# Patient Record
Sex: Female | Born: 1935
Health system: Southern US, Community
[De-identification: ages and names within clinical notes are randomized; demographics above are authoritative.]

## PROBLEM LIST (undated history)

## (undated) ENCOUNTER — Emergency Department (HOSPITAL_COMMUNITY): Admission: EM | Discharge status: Absent without leave | Payer: Medicare Other

## (undated) DIAGNOSIS — K5792 Diverticulitis of intestine, part unspecified, without perforation or abscess without bleeding: Secondary | ICD-10-CM

## (undated) DIAGNOSIS — K219 Gastro-esophageal reflux disease without esophagitis: Secondary | ICD-10-CM

## (undated) DIAGNOSIS — I679 Cerebrovascular disease, unspecified: Secondary | ICD-10-CM

## (undated) DIAGNOSIS — F419 Anxiety disorder, unspecified: Secondary | ICD-10-CM

## (undated) DIAGNOSIS — Z789 Other specified health status: Secondary | ICD-10-CM

## (undated) DIAGNOSIS — E785 Hyperlipidemia, unspecified: Secondary | ICD-10-CM

## (undated) HISTORY — DX: Cerebrovascular disease, unspecified: I67.9

## (undated) HISTORY — PX: CHOLECYSTECTOMY: SHX55

## (undated) HISTORY — DX: Hyperlipidemia, unspecified: E78.5

## (undated) HISTORY — DX: Anxiety disorder, unspecified: F41.9

## (undated) HISTORY — DX: Diverticulitis of intestine, part unspecified, without perforation or abscess without bleeding: K57.92

## (undated) HISTORY — PX: APPENDECTOMY: SHX54

## (undated) HISTORY — PX: TONSILLECTOMY: SHX5217

## (undated) HISTORY — DX: Other specified health status: Z78.9

## (undated) HISTORY — DX: Gastro-esophageal reflux disease without esophagitis: K21.9

---

## 2002-03-15 ENCOUNTER — Ambulatory Visit (HOSPITAL_COMMUNITY): Admission: RE | Admit: 2002-03-15 | Discharge: 2002-03-15 | Payer: Self-pay | Admitting: Internal Medicine

## 2004-12-09 ENCOUNTER — Ambulatory Visit: Payer: Self-pay | Admitting: Internal Medicine

## 2004-12-20 ENCOUNTER — Ambulatory Visit (HOSPITAL_COMMUNITY): Admission: RE | Admit: 2004-12-20 | Discharge: 2004-12-20 | Payer: Self-pay | Admitting: Internal Medicine

## 2004-12-20 ENCOUNTER — Ambulatory Visit: Payer: Self-pay | Admitting: Internal Medicine

## 2005-04-15 ENCOUNTER — Ambulatory Visit: Payer: Self-pay | Admitting: Cardiology

## 2005-04-21 ENCOUNTER — Ambulatory Visit: Payer: Self-pay | Admitting: Internal Medicine

## 2005-05-05 ENCOUNTER — Ambulatory Visit: Payer: Self-pay | Admitting: Internal Medicine

## 2005-05-22 ENCOUNTER — Ambulatory Visit: Payer: Self-pay | Admitting: Cardiology

## 2005-06-23 ENCOUNTER — Ambulatory Visit: Payer: Self-pay | Admitting: Cardiology

## 2006-04-28 ENCOUNTER — Ambulatory Visit: Payer: Self-pay | Admitting: Internal Medicine

## 2006-05-01 ENCOUNTER — Ambulatory Visit (HOSPITAL_COMMUNITY): Admission: RE | Admit: 2006-05-01 | Discharge: 2006-05-01 | Payer: Self-pay | Admitting: Internal Medicine

## 2006-05-07 ENCOUNTER — Ambulatory Visit: Payer: Self-pay | Admitting: Internal Medicine

## 2006-06-08 ENCOUNTER — Ambulatory Visit: Payer: Self-pay | Admitting: Cardiology

## 2006-06-23 ENCOUNTER — Ambulatory Visit: Payer: Self-pay | Admitting: Internal Medicine

## 2006-07-30 ENCOUNTER — Ambulatory Visit (HOSPITAL_COMMUNITY): Admission: RE | Admit: 2006-07-30 | Discharge: 2006-07-30 | Payer: Self-pay | Admitting: Internal Medicine

## 2006-10-05 ENCOUNTER — Ambulatory Visit (HOSPITAL_COMMUNITY): Admission: RE | Admit: 2006-10-05 | Discharge: 2006-10-05 | Payer: Self-pay | Admitting: Ophthalmology

## 2006-11-08 ENCOUNTER — Ambulatory Visit: Payer: Self-pay | Admitting: Cardiology

## 2007-01-27 ENCOUNTER — Ambulatory Visit (HOSPITAL_COMMUNITY): Admission: RE | Admit: 2007-01-27 | Discharge: 2007-01-27 | Payer: Self-pay | Admitting: Internal Medicine

## 2007-06-16 ENCOUNTER — Ambulatory Visit: Payer: Self-pay | Admitting: Internal Medicine

## 2007-09-27 ENCOUNTER — Ambulatory Visit: Payer: Self-pay | Admitting: Cardiology

## 2008-02-11 ENCOUNTER — Ambulatory Visit (HOSPITAL_COMMUNITY): Admission: RE | Admit: 2008-02-11 | Discharge: 2008-02-11 | Payer: Self-pay | Admitting: Internal Medicine

## 2008-04-10 ENCOUNTER — Ambulatory Visit: Payer: Self-pay | Admitting: Cardiology

## 2008-04-12 ENCOUNTER — Ambulatory Visit: Payer: Self-pay | Admitting: Cardiovascular Disease

## 2008-04-12 ENCOUNTER — Inpatient Hospital Stay (HOSPITAL_BASED_OUTPATIENT_CLINIC_OR_DEPARTMENT_OTHER): Admission: RE | Admit: 2008-04-12 | Discharge: 2008-04-12 | Payer: Self-pay | Admitting: Cardiovascular Disease

## 2008-04-18 ENCOUNTER — Ambulatory Visit: Payer: Self-pay | Admitting: Cardiology

## 2008-04-18 DIAGNOSIS — R079 Chest pain, unspecified: Secondary | ICD-10-CM

## 2008-04-18 DIAGNOSIS — E785 Hyperlipidemia, unspecified: Secondary | ICD-10-CM | POA: Insufficient documentation

## 2008-04-18 DIAGNOSIS — I2510 Atherosclerotic heart disease of native coronary artery without angina pectoris: Secondary | ICD-10-CM

## 2008-06-13 ENCOUNTER — Ambulatory Visit: Payer: Self-pay | Admitting: Internal Medicine

## 2008-08-15 ENCOUNTER — Encounter: Payer: Self-pay | Admitting: Internal Medicine

## 2008-08-30 ENCOUNTER — Encounter: Payer: Self-pay | Admitting: Internal Medicine

## 2008-09-04 ENCOUNTER — Ambulatory Visit (HOSPITAL_COMMUNITY): Admission: RE | Admit: 2008-09-04 | Discharge: 2008-09-04 | Payer: Self-pay | Admitting: Internal Medicine

## 2009-01-02 DIAGNOSIS — R1013 Epigastric pain: Secondary | ICD-10-CM

## 2009-01-02 DIAGNOSIS — K222 Esophageal obstruction: Secondary | ICD-10-CM | POA: Insufficient documentation

## 2009-01-02 DIAGNOSIS — R634 Abnormal weight loss: Secondary | ICD-10-CM

## 2009-01-02 DIAGNOSIS — R109 Unspecified abdominal pain: Secondary | ICD-10-CM | POA: Insufficient documentation

## 2010-10-08 NOTE — Assessment & Plan Note (Signed)
NAMEMarland Kitchen  Debra Spencer, Debra Spencer                CHART#:  [id number]   DATE:  06/16/2007                       DOB:  1927-08-13   Follow-up of gastroesophageal reflux disease.  Multiple pulmonary  nodules, stable.   HISTORY:  Debra Spencer was last seen 06/23/06.  Reflex symptoms continue  to be well controlled on Omeprazole 20 mg daily.  She is not having any  dysphagia.  She is status post Maloney dilation back in 2003.  She is  doing very well.  She had some pulmonary nodules seen on abdominal CT  previously.  We repeated her chest CT in September.  They appeared to be  stable.  Radiologist recommended one more CT in six to twelve months.  She had her last colonoscopy last year by Dr. Cleotis Nipper.  She was found  to have diverticulosis and OTC's by Dr. Cleotis Nipper and reportedly  everything looked good in 2008.   CURRENT MEDICATIONS:  See updated list.   ALLERGIES:  POLYMOX, MACRODANTIN, TRIMOX, ERYTHROMYCIN.   PHYSICAL EXAMINATION:  VITAL SIGNS:  She appears to weight 153.5.  Weight is up 9-1/2 pounds since her visit one year ago at 153.5.  Height  5 foot 4 inches, temperature 97.2, blood pressure 130/78, pulse 72.  SKIN:  Warm and dry.  CHEST:  Lungs are clear to auscultation.  HEART:  Regular rate and rhythm without murmurs, gallops or rubs.  ABDOMEN:  Nondistended.  Positive bowel sounds.  Soft.  Entirely  nontender without appreciable mass or organomegaly.  EXTREMITIES:  No edema.   IMPRESSION:  Debra Spencer is a pleasant 77 year old lady with a  history of GERD well controlled on Omeprazole.  Schatzki's ring dilated  previously with good long term results.  We talked about the  relationship between Schatzki's rings and reflux and the need to stay on  chronic acid suppression therapy.  She has stable benign appearing  pulmonary nodules on prior CT.  Per radiologist recommendations will  plan to get a chest CT in 12 months following the previous study which  would be September of this  year.  She is scheduled to see Dr. Sherril Croon next  month for physical.  Will get the interim follow-up CT in September  unless something comes up.  Plan to see this nice lady back in one year.       Jonathon Bellows, M.D.  Electronically Signed     RMR/MEDQ  D:  06/16/2007  T:  06/16/2007  Job:  [id number]   cc:   Doreen Beam, MD

## 2010-10-08 NOTE — Cardiovascular Report (Signed)
Debra Spencer, Debra Spencer               ACCOUNT NO.:  [id number]   MEDICAL RECORD NO.:  [id number]          PATIENT TYPE:  OIB   LOCATION:  1966                         FACILITY:  [facility]   PHYSICIAN:  Verne Carrow, MDDATE OF BIRTH:  May 25, 1928   DATE OF PROCEDURE:  04/12/2008  DATE OF DISCHARGE:  04/12/2008                            CARDIAC CATHETERIZATION   PRIMARY CARDIOLOGIST:  Learta Codding, MD,FACC   PROCEDURES PERFORMED:  1. Left heart catheterization.  2. Selective coronary angiography.  3. Left ventricular angiogram.   OPERATOR:  Verne Carrow, MD   INDICATIONS FOR PROCEDURE:  Chest pain with recent acceleration.   PROCEDURE IN DETAIL:  The patient was brought to the outpatient cardiac  catheterization laboratory after signing informed consent for the  procedure.  The right groin was prepped and draped in a sterile fashion.  Lidocaine 1% was used for local anesthesia.  A 4-French sheath was  inserted into the right femoral artery without difficulty.  A 4-French  JL-4 diagnostic catheter was used to selectively engage and inject the  left coronary system.  A 3DRC catheter was used to selectively engage  and inject the right coronary artery.  A 4-French pigtail catheter was  used to cross the aortic valve into the left ventricle.  Following the  left ventricular angiogram, the pigtail catheter was pulled back across  the aortic valve with a 10-mm peak-to-peak gradient noted across the  aortic valve.  The patient tolerated the procedure well and was taken to  the holding area in stable condition.   ANGIOGRAPHIC FINDINGS:  1. The left main coronary artery bifurcates into the circumflex and      the LAD.  There is no evidence of disease in the left main segment.  2. The left anterior descending is a large vessel that courses to the      apex.  There is mild plaque disease noted at the ostium of the LAD      that represents 10-20%.  There are luminal irregularities  noted      throughout the proximal and midportions of the LAD.  The LAD gives      rise to a large diagonal branch that is free of disease.  There was      also a large septal branch that is free of disease.  3. Circumflex artery.  The circumflex is composed of an early ramus      intermedius branch and a very large obtuse marginal branch.  The AV      groove circumflex is a small-caliber vessel.  There is plaque      disease noted in the large second obtuse marginal branch.  4. The right coronary artery is a large dominant artery.  There are      luminal irregularities noted in the proximal mid and distal      portions of this vessel.  The right coronary artery bifurcates into      a large posterior descending branch and a posterolateral branch      that are free of disease.  5. Left ventricular angiogram demonstrates normal left  ventricular      systolic function with no wall motion abnormalities noted.      Ejection fraction is estimated to 65%.   HEMODYNAMIC FINDINGS:  Central aortic pressure 111/53, LV pressure  120/9, and end-diastolic pressure 14.   IMPRESSION:  1. Isolated coronary plaque disease.  2. No evidence of obstructive coronary artery disease.  3. Normal left ventricular systolic function  4. Normal left ventricular filling pressures.   RECOMMENDATIONS:  Continued medical management and followup with her  primary cardiologist, Dr. Andee Lineman in 1-2 weeks.      Verne Carrow, MD  Electronically Signed     CM/MEDQ  D:  04/12/2008  T:  04/13/2008  Job:  [id number]   cc:   Learta Codding, MD,FACC

## 2010-10-08 NOTE — Assessment & Plan Note (Signed)
NAMEMarland Kitchen  Debra Spencer, Debra Spencer                CHART#:  [id number]   DATE:  06/13/2008                       DOB:  1927-07-12   Follow up gastroesophageal reflux disease and pulmonary nodule.   Up-to-date with colonoscopies per patient report (Dr. Cleotis Nipper).  The  patient returns for her 1-year followup.  Last seen on June 16, 2007.  Gastroesophageal reflux disease symptoms are well controlled on  omeprazole 20 mg orally daily, status post Maloney dilator, Schatzki  ring back in 2003.  She continues to do well from a dysphagia  standpoint.   She has stable pulmonary nodule.  She is due for one more chest CT in  March 2010.  She tells me that 4-5 times a year she has acute onset band-  like upper abdominal pain which last for 2-3 days.  She occasional sees  Dr. Sherril Croon.  She has had a workup over the [facility], has had a CT  scan in April, the results of which are unknown to me at this point in  time.  She wakes up with this pain and goes to bed with it, and have no  change in bowel habits.  No fever, no chills, no nausea or vomiting.  Symptoms then subside.  She sometimes has to take a pain pill.  In  between which is majority of the time, she does absolutely very well.  Her gallbladder is out (history of cholelithiasis).  She brings me her  abdominal CT on a CD-ROM today for review.  One prior CT of the abdomen  and pelvis done at [facility] on May 01, 2006, for abdominal pain  demonstrated a prominent bile duct, but nothing to explain abdominal  pain thus far.  She was found to have pulmonary nodules, her CBD was  upper limit of normal.  LFTs back on May 13, 2006, were entirely  normal as were amylase and lipase.   CURRENT MEDICATIONS:  See updated list.   ALLERGIES:  Polymox, Macrodantin, Trimox, erythromycin.   PHYSICAL EXAMINATION:  GENERAL:  Today she appears well.  VITAL SIGNS:  Her weight is actually up 4 pounds 157.5, height 5 feet 4,  temperature 97.4, BP 142/78,  and pulse 72.  SKIN:  Warm and dry.  HEENT:  No scleral icterus.  CHEST:  Lungs are clear to auscultation.  CARDIAC:  Regular rate and rhythm without murmur, gallop, or rub.  BREASTS:  Deferred.  ABDOMEN:  Nondistended, positive bowel sounds, entirely soft and  nontender, well-healed laparotomy scar.   ASSESSMENT:  1. Gastroesophageal reflux disease symptoms well controlled.      Pulmonary nodule likely benign.  One more chest CT needed in March      2010.  We will review the radiologist's interpretation of her      abdominal CT scan in the near future.  2. Intermittent abdominal pain with long periods of without symptoms.   RECOMMENDATIONS:  1. If she has recurrent attack of abdominal pain, she is to call us.      Hopefully, we will get her in during an attack.  She is to continue      Prilosec 20 mg orally daily.  Prescription given.  2. Unless something comes up, I will plan to see this nice lady back      in 6 months.  ADDENDUM:  She had a cardiac cath last fall under direction of Dr.  Andee Lineman and found to have normal coronaries per her report.       Jonathon Bellows, M.D.  Electronically Signed     RMR/MEDQ  D:  06/13/2008  T:  06/14/2008  Job:  [id number]   cc:   Doreen Beam, MD

## 2010-10-08 NOTE — Op Note (Signed)
Debra Spencer, Debra Spencer               ACCOUNT NO.:  [id number]   MEDICAL RECORD NO.:  [id number]          PATIENT TYPE:  AMB   LOCATION:  SDS                          FACILITY:  [facility]   PHYSICIAN:  Alford Highland. Rankin, M.D.   DATE OF BIRTH:  16-Apr-1928   DATE OF PROCEDURE:  10/05/2006  DATE OF DISCHARGE:                               OPERATIVE REPORT   PREOPERATIVE DIAGNOSIS:  Epiretinal membrane - right eye.   POSTOPERATIVE DIAGNOSES:  1. Epiretinal membrane - right eye.  2. Retinal hole 12 o'clock, as well as one temporally a thin spot in      the macular region adjacent to the fovea.   PROCEDURE:  1. Posterior vitrectomy with membrane peel - internal limited      membrane, right eye.  2. Internal limited membrane peel - right eye.  3. Focalized photocoagulation right eye to retinal holes.   SURGEON:  Alford Highland. Rankin, M.D.   ANESTHESIA:  Local with anesthesia control.   INDICATIONS FOR PROCEDURE:  The patient is a 77 year old woman who has  significant impairment of visual function on the basis of severe  topographic distortion induced from the epiretinal membrane.  The  patient understands that this is an attempt to remove the epiretinal  membrane and the underlying topographic distortion.  She understands the  risks of anesthesia include the risk of death, loss of the eye from the  underlying condition, as well as the surgical intervention including but  not limited to hemorrhage, infection, scarring, need for further  surgery, no change in vision, loss of vision, progression of disease  prior to intervention.  Appropriate signed consent was obtained.  Patient was taken to the operating room.   DESCRIPTION OF PROCEDURE:  In the operating room appropriate monitoring  was followed by mild sedation.  Marcaine 0.75% was delivered, 5 mL  retrobulbar, followed by additional 5 mL laterally in the fashion of a  modified [employer]  The right periocular region was sterilely prepped  and  draped in the usual ophthalmic fashion.  A lid speculum was applied.  A 25-guage trocar system was then used for the infusion in the  inferotemporal quadrant.  Superior trocars applied.  Coarctectomy was  then begun with the microscope __________  attachment.  A 25-guage  forceps were then used to engage the epiretinal membrane.  There was a  deeper internal limited membrane and this was also removed in a  secondary peeling technique.  Retinal hole percolation was found in the  vitreous base at the 12 o'clock position.  Endolaser photocoagulation  placed at this time.  A small hole appeared to be developing in the area  of internal limited membrane peel temporal to the fovea and this was  also treated with focalized photocoagulation.   At this time no other complications occurred.  The superior trocars were  removed.  The infusion removed.  Subconjunctival Decadron applied.  A  sterile patch and a Fox shield were applied.  The patient tolerated the  procedure without complication.  The patient was taken to the short-stay  area and discharged  home as an outpatient.      Alford Highland Rankin, M.D.  Electronically Signed     GAR/MEDQ  D:  10/05/2006  T:  10/05/2006  Job:  [id number]

## 2010-10-08 NOTE — Assessment & Plan Note (Signed)
[facility]                          [facility]ICE NOTE   NAME:Debra Spencer, Debra Spencer                      MRN:          [id number]  DATE:04/10/2008                            DOB:          [birth year]    PRIMARY CARDIOLOGIST:  Dr. Simona Huh.   REASON FOR VISIT:  Scheduled follow up.   Debra Spencer is a delightful 77 year old female with history of presumed  CAD treated medically, last seen by [place] here in the clinic in May of this  year.  Her last stress test was an adenosine perfusion study in June  2008 for evaluation of chest pain, which we felt was atypical.  Her  study was negative for ischemia.   Debra Spencer has never undergone coronary angiography.  She has been  treated conservatively for probable CAD, after having undergone a low-  risk exercise stress Cardiolite in November of 2006.  This yielded a  very small area of possible apical ischemia, EF 77%.   Her cardiac risk factors are notable for dyslipidemia, family history,  and age.  She has never smoked tobacco and has no history of diabetes  mellitus.   At time of her last office visit, I started her on a statin with  simvastatin 40 mg.  She informs me today, however, that she developed  significant myalgias with this and stopped taking it on her own about 1  month ago.  Her symptoms subsequently completely resolved.  She was on  fish oil when I saw her last, and she has since increased this on her  own.  Her last lipid profile shortly following her previous office visit  indicated was notable for an LDL of 114 with HDL 34, triglyceride 149,  and total cholesterol 178.   Since our last visit, Debra Spencer informs me today that she has  developed new left upper chest discomfort which awakens her early in the  morning.  This is not similar to her typical reflux symptoms, which are  significant.  This chest discomfort is described as dull and there is  radiation down into the left  upper extremity.  She has never taken  anything for it and just waits for the symptoms to resolve after a short  while. She seems to suggest that her last episode occurred approximately  a week ago.  More recently, however, she has noted worsening shortness  of breath with exertion and a sensation of easy fatigability.  She cites  typical activities such as vacuuming or raking the leaves, which bring  on these symptoms.  Most recently she reports having had some mild chest  pressure earlier this morning, while she was raking leaves, which  promptly resolved with rest.  She has not had any recurrent symptoms.   An EKG in our office today indicates NSR with normal axis and no acute  changes.  There is poor R-wave progression in the precordial leads which  is a chronic finding.   ALLERGIES:  ERYTHROMYCIN, PENICILLIN, and NITROFURANTOIN.  SIMVASTATIN intolerant.   PAST MEDICAL HISTORY:  1. Presumed coronary artery disease, treated medically.  a.     Negative adenosine stress Cardiolite, EF 77%, June 2008.      b.     Low-risk exercise stress Cardiolite, November 2006:       Suggestive of possible small area of apical ischemia.  2. Dyslipidemia.  3. Nonobstructive cerebrovascular disease.  Carotid Dopplers, July      2008.  4. GERD.  5. Diverticulitis.  6. History of nitrate intolerance.  7. History of anxiety.   SOCIAL HISTORY:  The patient is married, has three grown children and  six grandchildren.  She is retired Scientist, product/process development.  She has never smoked  tobacco and denies alcohol use.   FAMILY HISTORY:  Father died at age of 4, fatal myocardial function.  Brother died at age 37 also was fatal myocardial infarction.   REVIEW OF SYSTEMS:  The patient has significant heartburn but states  that this is different from her recent nocturnal angina as well as  exertional chest discomfort.  Denied PND, orthopnea, or significant  lower extremity edema.  She is hard of hearing and  wears bilateral  hearing aids.  Otherwise, as per HPI remaining systems negative.   PHYSICAL EXAMINATION:  VITAL SIGNS:  Blood pressure 140/77, pulse 77  regular, weight 152.8.  GENERAL:  A 77 year old female, sitting upright, no distress.  HEENT:  Normocephalic, atraumatic.  PERRLA.  EOMI.  NECK:  Palpable bilateral carotid pulses without bruits; no JVD at 90  degrees.  LUNGS:  Clear to auscultation all fields.  HEART:  Regular rate and rhythm.  No significant murmurs.  No rubs.  ABDOMEN:  Soft, nontender, intact bowel sounds.  EXTREMITIES:  Palpable bilateral femoral pulses without bruits; intact  distal pulses with no significant pedal edema.  SKIN:  Warm and dry.  MUSCULOSKELETAL:  No gross deformity.  NEURO:  Flat affect but no focal deficit.   IMPRESSION:  Debra Spencer is a very pleasant 77 year old female with  history of presumed coronary artery disease, treated medically, who now  presents with symptoms suggestive of accelerating angina pectoris.  She  presents with new-onset nocturnal angina pectoris as well as worsening  exertional dyspnea and easy fatigability.  She is currently  hemodynamically stable and asymptomatic and electrocardiogram in our  office does not show any definite evidence of active ischemia.  Therefore, we are recommending a diagnostic coronary angiogram to be  scheduled within the next 24-48 hours in our [facility] catheterization lab.  The patient is agreeable with this plan, which has been approved by Dr.  Andee Lineman.  The patient has been strongly advised to refrain from any  strenuous activities in the meanwhile.   Medications will be adjusted with up titration of aspirin to full dose,  up titration of Lopressor to 25 mg b.i.d., addition of Imdur 30 mg  daily, and renewal of p.r.n. nitroglycerin.      Gene Serpe, PA-C  Electronically Signed      Learta Codding, MD,FACC  Electronically Signed   GS/MedQ  DD: 04/10/2008  DT: 04/10/2008  Job #:  [id number]   cc:   Doreen Beam, MD

## 2010-10-08 NOTE — Assessment & Plan Note (Signed)
[facility]                          [facility]ICE NOTE   NAME:Debra Spencer, Debra Spencer                      MRN:          [id number]  DATE:09/27/2007                            DOB:          1927-12-06    PRIMARY CARE CARDIOLOGIST:  Dr. Simona Huh.   REASON FOR VISIT:  Annual follow-up.   Ms. Rogel is a very pleasant 77 year old female, with history of  presumed CAD treated medically, last seen by [place] here at [facility] in June  2008, for evaluation of chest pain.  Her symptoms were felt to be quite  atypical, and she was referred for an adenosine stress Cardiolite which  was negative for ischemia; EF 77%.   The patient continues to report no symptoms suggestive of exertional  angina pectoris.  She remains active both in and out of her home,  although she does not walk on a regular basis.   The patient has no history of tobacco smoking.   Most recent lipid profile this past February notable for a total  cholesterol 213, triglyceride 83, HDL 52 and LDL 144.  The patient was  subsequently started on fish oil and red yeast rice.  She has never been  on a statin.   Electrocardiogram today reveals NSR at 75 bpm with normal axis; Q-waves  in leads V1 and V2, essentially unchanged.   CURRENT MEDICATIONS:  1. Aspirin 81 daily.  2. Metoprolol ER 25 daily.  3. Omeprazole.  4. Fish oil 1000.  5. Red yeast rice.   PHYSICAL EXAMINATION:  Blood pressure 120/71, pulse 74/regular, weight  130 (down 13).  GENERAL:  A 77 year old female sitting upright in no distress.  HEENT:  Normocephalic, atraumatic.  NECK:  Palpable carotid pulses without bruits; no JVD.  LUNGS:  Clear to auscultation in all fields.  HEART:  Regular rate and rhythm (S1/S2); no significant murmurs.  ABDOMEN:  Soft, nontender.  EXTREMITIES:  Palpable pulses without edema.  NEUROLOGICAL:  No focal deficit.   IMPRESSION:  1. Presumed coronary artery disease.      a.     Treated  medically.      b.     Negative adenosine stress Cardiolite; EF 77%, June 2008.      c.     Low-risk exercise stress Cardiolite, November 2006:       suggestive of possible small area of apical ischemia.  2. Dyslipidemia.  3. Nitrate intolerant.      a.     History of severe headache.  4. Gastroesophageal reflux disease.  5. Nonobstructive cerebrovascular disease.      a.     Carotid Dopplers, July 2008.   PLAN:  1. Reassess lipid status with a follow-up FLP/LFT.  If the LDL is not      at goal, then I recommend adding statin with simvastatin 40 mg      every night.  2. Substitute long-acting metoprolol with short-acting Lopressor at      12.5 b.i.d., so as to minimize costs.  The patient has requested a      1-month supply with refills.  3. Schedule return clinic follow-up with Dr. Simona Huh in 1 year.      Gene Serpe, PA-C  Electronically Signed      Learta Codding, MD,FACC  Electronically Signed   GS/MedQ  DD: 09/27/2007  DT: 09/27/2007  Job #: [id number]   cc:   Doreen Beam, MD

## 2010-10-08 NOTE — Assessment & Plan Note (Signed)
[facility]                          [facility] OFFICE NOTE   NAME:Debra Spencer, Debra Spencer                      MRN:          [id number]  DATE:04/18/2008                            DOB:          Jan 11, 1928    PRIMARY CARE PHYSICIAN:  Doreen Beam, MD   REASON FOR VISIT:  Post cardiac catheterization followup.   HISTORY OF PRESENT ILLNESS:  Ms. Debra Spencer was seen recently in the office  by Mr. Serpe along with Dr. Andee Lineman.  She was describing symptoms of left  upper chest discomfort in the morning hours, somewhat different from her  typical reflux symptoms.  It was felt that a diagnostic cardiac  catheterization was needed and she was scheduled for an outpatient  evaluation.  She underwent cardiac catheterization by Dr. Clifton James on  April 12, 2008, and this study revealed mild coronary plaquing with  10-20% stenosis in the ostial left anterior descending, minor  atherosclerotic plaquing within the circumflex marginal system, and  minor luminal irregularities within the right coronary artery.  Left  ventricular ejection fraction was 65% with no regional wall motion  abnormalities.  Medical therapy was appropriately recommended at that  time.  Ms. Debra Spencer returns today stating that she feels better, in fact  more reassured.  She has been under a lot of stress and was wondering  whether that might have been related to her symptoms.  She continues on  the medicines as outlined below, but had to cut back her Imdur to 15 mg  daily, given headaches and also had to stop her simvastatin as already  mentioned given myalgias.  Today, we talked about potentially trying  Pravachol and otherwise plan to continued risk factor modification.   ALLERGIES:  ERYTHROMYCIN, TRIMOX, POLYMOX, and MACRODANTIN.  Also,  intolerance to SIMVASTATIN related to myalgias.   PRESENT MEDICATIONS:  1. Aspirin 81 mg 4 tablets p.o. daily.  2. Omeprazole 20 mg p.o. daily.  3. Multivitamin  1 p.o. daily.  4. Calcium citrate with vitamin D once daily.  5. Omega-3 supplements 1000 mg p.o. t.i.d.  6. Metoprolol 25 mg p.o. b.i.d.  7. Imdur 15 mg p.o. daily.  8. Sublingual nitroglycerin 0.4 mg p.r.n.   REVIEW OF SYSTEMS:  As described in the history of present illness.  Otherwise, negative.  The patient states that her right groin site has  healed well.   PHYSICAL EXAMINATION:  VITAL SIGNS:  Blood pressure is 133/76, heart  rate is 74, and weight is 156 pounds.  The patient is comfortable and in  no acute distress.  NECK:  No elevated jugular venous pressure, loud bruits, or thyromegaly  noted.  LUNGS:  Clear without labored breathing.  CARDIAC:  Reveals a regular rate and rhythm.  No S3 gallop or  pericardial rub.  Groin site is stable.  Status post catheterization.  EXTREMITIES:  Exhibit no pitting edema.   IMPRESSION/RECOMMENDATIONS:  Recent chest pain, likely noncardiac, based  on very reassuring coronary anatomy documented at cardiac  catheterization on April 12, 2008.  She had only minor  atherosclerosis.  At this point, the plan will be  risk factor  modification.  She will continue on aspirin, Omega-3 supplements,  metoprolol, and at least for the time being 15 mg of Imdur, to see if  this leads to any symptom improvement.  She may be having esophageal  spasm with known history of reflux.  I have also recommended a trial of  Pravachol 20 mg p.o. nightly, and if she tolerates this, we will check a  repeat lipid profile and liver function tests over the next 12 weeks.  Otherwise, cardiology followup will be in 1 year's time.     Jonelle Sidle, MD  Electronically Signed    SGM/MedQ  DD: 04/18/2008  DT: 04/19/2008  Job #: [id number]   cc:   Doreen Beam, MD

## 2010-10-11 NOTE — H&P (Signed)
Debra Spencer               ACCOUNT NO.:  [id number]   MEDICAL RECORD NO.:  [id number]          PATIENT TYPE:  AMB   LOCATION:                                FACILITY:  [facility]   PHYSICIAN:  R. Roetta Sessions, M.D. DATE OF BIRTH:  March 17, 1928   DATE OF ADMISSION:  DATE OF DISCHARGE:  LH                                HISTORY & PHYSICAL   CHIEF COMPLAINT:  Recurrent esophageal dysphagia, heart burn.   HISTORY:  Ms. Debra Spencer is a pleasant, 77 year old Caucasian female with a  long history of gastroesophageal reflux disease followed primarily by Dr.  Berline Chough in [place]. She came to see me for recurrent reflux symptoms and  intermittent esophageal dysphagia to solids. She has been taking Prevacid  off and on for the past 2 to 3 years but does not take it on a regular  basis. She had been on multiple other acid suppressant agents, none of which  have worked well previously. She last underwent EGD back in March 15, 2002. She was found to have erosive reflux esophagitis with Schatzki's ring.  She was dilated with a #54 French balloon dilator. She was seen back on  April 26, 2002 at which time she was doing very well on Prevacid. She has  not had any significant intercurrent illnesses since last being seen here.  She says over the past several months dysphagia has recurred.   PAST MEDICAL HISTORY:  Specific for gastroesophageal reflux disease,  dysphagia.   PAST SURGICAL HISTORY:  Tonsillectomy, cholecystectomy, appendectomy.   CURRENT MEDICATIONS:  1.  Benefiber 1 daily.  2.  Multivitamin daily.  3.  Calcium 600 with vitamin D daily.  4.  Prevacid 30 mg - not taking regularly.  5.  Vitamin C 500 mg daily.   ALLERGIES:  POLYMOX, MACRODANTIN, TRIMOX, AZITHROMYCIN.   FAMILY HISTORY:  Mother succumbed to myeloma. Father died with a myocardial  infarction.   Her last colonoscopy was with Dr. Cleotis Nipper in 2002. Diverticulosis was the  only significant finding.   SOCIAL HISTORY:   The patient is married. She is retired from [employer]. No  tobacco, no alcohol.   REVIEW OF SYSTEMS:  No chest pain or dyspnea on exertion.   PHYSICAL EXAMINATION:  GENERAL:  A pleasant, 77 year old lady resting  comfortably.  VITAL SIGNS:  Weight 158. Height 5 feet, 5 inches. Temperature 97.6. Blood  pressure 140/80, pulse 78.  SKIN:  Warm and dry.  HEENT EXAM:  No scleral icterus. JVD is not prominent.  CHEST:  Lungs are clear to auscultation.  BREAST EXAM:  Deferred.  ABDOMEN:  Non distended, positive bowel sounds, soft, nontender without  appreciable mass or organomegaly.  EXTREMITIES:  No edema.   ADMISSION IMPRESSION:  Ms. Debra Spencer is a pleasant, 77 year old lady with  long-standing gastroesophageal reflex disease and history of erosive  esophagitis and history of a Schatzki's ring. She has recurrent esophageal  dysphagia, her dysphagia symptoms previously responded to dilation of her  ring. In this setting we ought to go ahead and just plan to perform an  esophagogastroduodenoscopy with esophageal  dilation as appropriate in the  very near future. The potential risks, benefits and alternatives have been  reviewed, questions answered. I pointed out the chronic nature of  gastroesophageal reflux disease with Ms. Debra Spencer and the potential need to  stay on acid suppression therapy on a DAILY basis.  Will make further recommendations in the very near future.       RMR/MEDQ  D:  12/10/2004  T:  12/10/2004  Job:  [id number]   cc:   Doreen Beam  [address]  [place]  [place] [address]  Fax: [phone]   R. Roetta Sessions, M.D.  [address]  [place]  [place] [address]

## 2010-10-11 NOTE — Op Note (Signed)
NAME:  Debra Spencer, Debra Spencer                         ACCOUNT NO.:  [id number]   MEDICAL RECORD NO.:  [id number]                   PATIENT TYPE:  AMB   LOCATION:  DAY                                  FACILITY:  [facility]   PHYSICIAN:  R. Roetta Sessions, M.D.              DATE OF BIRTH:  05-07-1928   DATE OF PROCEDURE:  DATE OF DISCHARGE:                                 OPERATIVE REPORT   PROCEDURE:  Esophagogastroduodenoscopy with Maloney dilation.   ENDOSCOPIST:  Gerrit Friends. Rourk, M.D.   INDICATIONS FOR PROCEDURE:  The patient is a 77 year old lady with crescendo  reflux symptoms, intermittent esophageal dysphagia.  EGD is now being done to further evaluate her symptoms.  She also is having  some bilateral lower quadrant abdominal pain which has resolved since she  has increased her Benefiber to twice daily and having more normal bowel  movements.  This approach has been discussed with the patient the potential  risks, benefits, and alternatives have been reviewed; and questions  answered.  She is agreeable.  Please see my dictated consultation note of  03/03/02 for more information.   DESCRIPTION OF PROCEDURE:  O2 saturation, blood pressure, pulse and  respirations were monitored throughout the entire procedure.   CONSCIOUS SEDATION:  Versed 3 mg IV, Demerol 50 mg IV in divided doses.   INSTRUMENT:  Olympus video chip adult gastroscope.   FINDINGS:  Examination of the tubular esophagus revealed a couple of distal  esophageal erosions at the 5 o'clock position.  There was also an  interesting Schatzki ring.  There was no evidence of Barrett's esophagus or  neoplasm.  The EG junction was easily traversed with the scope.   STOMACH:  The gastric cavity was empty.  It insufflated well with air.  A  thorough examination of the gastric mucosa including a retroflex view of the  proximal stomach and esophagogastric junction demonstrated only a small  hiatal hernia.  The pylorus was patent and  easily traversed.   DUODENUM:  The bulb and the second portion were well seen and appeared  normal.   THERAPY/DIAGNOSTIC MANEUVERS:  A 54 French Maloney dilator was passed to  full insertion with good patient tolerance.  A look back revealed that the  ring had been ruptured without apparent complications.   The patient tolerated the procedure well and was reacted in endoscopy.   IMPRESSION:  1. Distal esophageal erosions with mild erosive reflux esophagitis.  2. Schatzki ring status post dilation as described above.  3. The remainder of the esophageal mucosa appeared normal.  4. Small hiatal hernia.  5. The remainder of the gastric mucosa and duodenum through the second     portion appeared normal.   RECOMMENDATIONS:  1. The patient is resume her Prevacid 30 mg orally daily.  2. Antireflux measures literature given to the patient.  3. Continue Benefiber twice daily.  4. Office visit to see  how she is doing in 6 weeks prior to concluding     consultation.                                               Jonathon Bellows, M.D.    RMR/MEDQ  D:  03/15/2002  T:  03/15/2002  Job:  [id number]   cc:   Zigmund Gottron  [address]  [place] [address]  Fax: [phone]

## 2010-10-11 NOTE — Consult Note (Signed)
Debra Spencer, Debra Spencer                           ACCOUNT NO.:  [id number]   MEDICAL RECORD NO.:  [id number]                  PATIENT TYPE:   LOCATION:                                       FACILITY:   PHYSICIAN:  R. Roetta Sessions, M.D.              DATE OF BIRTH:  Jul 13, 1927   DATE OF CONSULTATION:  03/03/2002  DATE OF DISCHARGE:                                   CONSULTATION   REASON FOR REFERRAL:  Intermittent abdominal pain and gastroesophageal  reflux disease.   HISTORY OF PRESENT ILLNESS:  This patient is a pleasant 77 year old lady  kindly sent over at the request of Dr. Johny Shears to further evaluate at least  a couple of years history of recurrent abdominal pain.  She tells me that  she has intermittent episodes of band-like periumbilical pain radiating into  her lower quadrants occasionally.  She has had 2-3 episodes this year.  She  does not have any fever or chills.  She gets nausea, but no vomiting. No  change in bowel habits.  No melena or rectal bleeding.  Denies fever or  chills.  She has been treated for diverticulitis previously by Dr. Johny Shears.  Last episode was last month.  She was treated with a course of Bactrim.   She has been taking Benefiber 1 heaping tablespoon each morning, which she  does not mind taking at all, it seems to help her bowels per her report.   A CT scan back in May of last year demonstrated diverticulosis but no  evidence of diverticulitis.  It is reassuring though that she had a  colonoscopy by Dr. Cleotis Nipper last year which revealed only diverticulosis.  She has not had a pelvic exam in some time, but this was done a few years  ago by Dr. Margo Common.  She has not lost any weight.  She also complains of  heartburn/indigestion symptoms which she has almost every day now.  Things  have worsened in the past several months.  She has reflux symptoms multiple  times weekly and has had these symptoms for several years.  She is perplexed  as to why things  have worsened.  She also gives a history of intermittent  esophageal dysphagia to solids.  She does not have early satiety, no  vomiting.  She denies weight loss.  She does not use tobacco or alcohol  products.  She has never had her upper GI tract evaluated.  She tells me  that she took Prevacid and subsequently Pepcid for a short time which nearly  abolished her symptoms completely, but she is not on acid suppression at  this time.   PAST MEDICAL HISTORY:  Significant for diverticulosis, history of  gastroesophageal reflux disease as described above.   PAST SURGICAL HISTORY:  Tonsillectomy, cholecystectomy, and appendectomy.   CURRENT MEDICATIONS:  Benefiber daily, multi-vitamin, calcium, and vitamin D  supplement daily.   ALLERGIES:  ERYTHROMYCIN, TRIMOX, POLYMOX, MACRODANTIN.   FAMILY HISTORY:  Mother succumbed to some type of cancer, possibly melanoma.  Father died with an MI.  No history of chronic GI or liver disease.   SOCIAL HISTORY:  The patient is married for 50 years, has 3 children.  She  is retired.  No tobacco or alcohol abuse.   REVIEW OF SYSTEMS:  No chest pain, no dyspnea on exertion.  No fever or  chills.  No change in weight.   PHYSICAL EXAMINATION:  GENERAL:  Reveals a pleasant, 77 year old lady  resting comfortably.  VITAL SIGNS:  Weight 154.5, height 5 feet 5 inches.  Temperature 97.9, BP  132/72, pulse 76.  SKIN:  Warm and dry.  No jaundice.  No cutaneous stigmata of chronic liver  disease.  CHEST:  Lungs are clear to auscultation.  CARDIAC:  Regular rate and rhythm without murmur, gallop, or rub.  ABDOMEN:  Nondistended, positive bowel sounds.  Soft, nontender, without  appreciable mass or organomegaly.  EXTREMITIES:  No edema.   IMPRESSION:  This patient is a pleasant 77 year old lady with intermittent  abdominal pain which last no more than 2-3 days.  She has had 3 episodes  this year and episodes prior to 2003.  She has known diverticulosis.  No   evidence of diverticulitis in May 2002 on CT scan.  Certainly she may be  having low-grade bouts of diverticulitis.  Symptoms have not worsened and  the tempo has not increased.  She is asymptomatic today from the standpoint  of her abdominal pain.   She has frequent crescendo reflux symptoms in an elderly individual who also  has esophageal dysphagia this demands further evaluation.   RECOMMENDATIONS:  1. I recommend that the patient increase her Benefiber to 2 tablespoons     daily.  I have given her literature on diverticulosis.  2. I have offered the patient an upper gastrointestinal endoscopy to further     evaluate her worsening reflux symptoms/dysphagia.  I have discussed this     approach with the patient.  The potential risks, benefits, and     alternatives have been reviewed.  I feel that she is low risk for     conscious sedation.  3. I will plan to perform this procedure in the near future at [facility]  I would like to retrieve Dr. Verne Grain colonoscopy report     for review.  4. Further recommendations to follow.   I would like to thank Dr. Johny Shears for allowing me to see this nice lady.                                               Jonathon Bellows, M.D.    RMR/MEDQ  D:  03/03/2002  T:  03/03/2002  Job:  [id number]   cc:   Holley Dexter, M.D.  [place], [place]

## 2010-10-11 NOTE — Op Note (Signed)
NAMEMARIELLEN, Debra Spencer               ACCOUNT NO.:  [id number]   MEDICAL RECORD NO.:  [id number]          PATIENT TYPE:  AMB   LOCATION:  DAY                           FACILITY:  [facility]   PHYSICIAN:  R. Roetta Sessions, M.D. DATE OF BIRTH:  [birth year]   DATE OF PROCEDURE:  12/20/2004  DATE OF DISCHARGE:                                 OPERATIVE REPORT   PROCEDURE:  Esophagogastroduodenoscopy with Elease Hashimoto dilation.   INDICATIONS FOR PROCEDURE:  The patient is a 77 year old lady with recurrent  esophageal dysphagia. She has a history of Schatzki's ring that has required  dilation previously. EGD with esophageal dilation is now being performed.  Potential risks, benefits, and alternatives have been reviewed and questions  answered. She is agreeable. Please see documentation in the medical record.   PROCEDURE NOTE:  O2 saturation, blood pressure, pulse, and respirations were  monitored throughout the entire procedure. Conscious sedation with Versed 2  mg IV and Demerol 50 mg IV in divided doses.   INSTRUMENT:  Olympus video chip system.   FINDINGS:  Esophagus. Examination of the tubular esophagus revealed a  Schatzki's ring. Esophageal mucosa otherwise appeared normal. EG junction  was easily traversed with scope.   Stomach:  Gastric cavity was empty and insufflated well with air. Thorough  examination of gastric mucosa including retroflexed view of the proximal  stomach and esophagogastric junction demonstrated only small hiatal hernia.  Pylorus patent and easily traversed. Examination of bulb and second portion  revealed no abnormalities.   THERAPEUTIC/DIAGNOSTIC MANEUVERS:  A 56-French Maloney dilator was passed  with ease. Look back revealed ring remained intact. Subsequently, a 9-  [place] Maloney dilator was passed fully. A look back revealed the ring still  remained intact. Subsequently, using the biopsy forceps, two quadrant bites  of the ring were taken to disrupt this. This was  done effectively without  apparent complication. The patient tolerated the procedure well and was  reactive to endoscopy.   IMPRESSION:  Schatzki's ring. Otherwise normal esophagus. Status post  dilation and disruption as described above. Otherwise normal esophagus.  Small hiatal hernia. Otherwise normal stomach. Normal D1 and D2.   RECOMMENDATIONS:  1.  Prevacid 30 mg every day.  2.  The patient is to call if she has any future swallowing difficulties.       RMR/MEDQ  D:  12/20/2004  T:  12/20/2004  Job:  [id number]   cc:   Doreen Beam  [address]  [place]  [place] [address]  Fax: [phone]

## 2010-10-11 NOTE — Assessment & Plan Note (Signed)
[facility]                          [facility] OFFICE NOTE   NAME:Debra [place], Debra Spencer                      MRN:          [id number]  DATE:06/08/2006                            DOB:          [birth year]    PRIMARY CARDIOLOGIST:  Debra Spencer.   REASON FOR VISIT:  Six-month followup.   HISTORY:  Debra Spencer is 77 year old female, with presumed coronary  artery disease, who presents for scheduled followup.  She was last seen  here in the clinic in January 2007, by Dr. Diona Browner.   The patient is being treated conservatively for her probable underlying  coronary artery disease.  She initially presented with chest pain and  had a low risk exercise Cardiolite in November 2006 yielding a very  small area of possible apical ischemia.  Calculated ejection fraction  was 77%.   The patient has done well on low-dose beta blocker.  She has not,  however, tolerated Imdur in the past, secondary to significant  headaches.   The patient denies any interim development of any chest tightness,  pressure of discomfort.  She denies exertional dyspnea and tachy  palpitations.   The patient has history of GERD and this has been evaluated by Dr. Jena Gauss  in [place].   The patient has no history of tobacco smoking.   The patient states that she has had her lipid profile assessed in the  past, but not recently.  She apparently has never been placed on a lipid-  lowering agent.   Electrocardiogram today reveals normal sinus rhythm at 75 BPM with  normal axis and no ischemic changes.   CURRENT MEDICATIONS:  1. Metoprolol 25 daily.  2. Aspirin.  3. Prevacid.   PHYSICAL EXAMINATION:  Blood pressure 132/74, pulse 84, regular, weight  143.  GENERAL:  A 77 year old female sitting upright in no distress.  HEENT:  Normocephalic and atraumatic.  NECK:  Palpable bilateral carotid pulses without bruits.  LUNGS:  Clear to auscultation in all fields.  HEART:   Regular rate and rhythm (S1, S2), soft S4.  No murmurs.  ABDOMEN:  Soft, nontender.  EXTREMITIES:  Palpable pulses without edema.  NEURO:  No focal deficit.   IMPRESSION:  1. Chest pain.      a.     Quiescent.      b.     Low risk exercise stress Cardiolite in November 2006:       Suggestive of possible small area of apical ischemia.      c.     Normal left ventricular function.  2. GERD.  3. Nitrate intolerant.      a.     History of severe headache.  4. Anxiety.   PLAN:  1. Down titrate aspirin to 81 mg daily.  2. Request fasting lipid records from Dr. Sherril Croon' office to be placed in      the patient's chart.  3. Defer management of anxiety to Dr. Sherril Croon.  4. Continue regular followup with Debra Spencer in one year.      Gene Serpe, PA-C  Electronically Signed  Jonelle Sidle, MD  Electronically Signed   GS/MedQ  DD: 06/08/2006  DT: 06/08/2006  Job #: [id number]   cc:   Doreen Beam

## 2010-12-02 ENCOUNTER — Encounter: Payer: Self-pay | Admitting: Cardiology

## 2010-12-25 ENCOUNTER — Other Ambulatory Visit: Payer: Self-pay | Admitting: Neurosurgery

## 2010-12-25 DIAGNOSIS — M545 Low back pain: Secondary | ICD-10-CM

## 2010-12-25 DIAGNOSIS — M47816 Spondylosis without myelopathy or radiculopathy, lumbar region: Secondary | ICD-10-CM

## 2010-12-26 ENCOUNTER — Ambulatory Visit
Admission: RE | Admit: 2010-12-26 | Discharge: 2010-12-26 | Disposition: A | Discharge status: Home / Self Care | Payer: Medicare PPO | Source: Ambulatory Visit | Attending: Neurosurgery | Admitting: Neurosurgery

## 2010-12-26 DIAGNOSIS — M545 Low back pain, unspecified: Secondary | ICD-10-CM

## 2010-12-26 DIAGNOSIS — M47816 Spondylosis without myelopathy or radiculopathy, lumbar region: Secondary | ICD-10-CM

## 2010-12-26 MED ORDER — METHYLPREDNISOLONE ACETATE 40 MG/ML INJ SUSP (RADIOLOG
120.0000 mg | Freq: Once | INTRAMUSCULAR | Status: AC
  Administered 2010-12-26: 120 mg via EPIDURAL

## 2010-12-26 MED ORDER — IOHEXOL 180 MG/ML  SOLN
1.0000 mL | Freq: Once | INTRAMUSCULAR | Status: AC | PRN
  Administered 2010-12-26: 1 mL via EPIDURAL

## 2011-01-30 ENCOUNTER — Other Ambulatory Visit: Payer: Self-pay | Admitting: Neurosurgery

## 2011-01-30 DIAGNOSIS — M545 Low back pain: Secondary | ICD-10-CM

## 2011-01-30 DIAGNOSIS — M47816 Spondylosis without myelopathy or radiculopathy, lumbar region: Secondary | ICD-10-CM

## 2011-02-05 ENCOUNTER — Ambulatory Visit
Admission: RE | Admit: 2011-02-05 | Discharge: 2011-02-05 | Disposition: A | Discharge status: Home / Self Care | Payer: Medicare PPO | Source: Ambulatory Visit | Attending: Neurosurgery | Admitting: Neurosurgery

## 2011-02-05 DIAGNOSIS — M47816 Spondylosis without myelopathy or radiculopathy, lumbar region: Secondary | ICD-10-CM

## 2011-02-05 DIAGNOSIS — M545 Low back pain: Secondary | ICD-10-CM

## 2011-02-05 MED ORDER — IOHEXOL 180 MG/ML  SOLN: 1.0000 mL | Freq: Once | INTRAMUSCULAR | Status: AC | PRN

## 2011-02-05 MED ORDER — METHYLPREDNISOLONE ACETATE 40 MG/ML INJ SUSP (RADIOLOG: 120.0000 mg | Freq: Once | INTRAMUSCULAR | Status: DC

## 2015-04-11 ENCOUNTER — Encounter: Payer: Self-pay | Admitting: Cardiovascular Disease

## 2015-04-11 ENCOUNTER — Ambulatory Visit (INDEPENDENT_AMBULATORY_CARE_PROVIDER_SITE_OTHER): Payer: Medicare Other | Admitting: Cardiovascular Disease

## 2015-04-11 VITALS — BP 170/82 | HR 63 | Resp 22 | Ht 64.0 in | Wt 149.3 lb

## 2015-04-11 DIAGNOSIS — R079 Chest pain, unspecified: Secondary | ICD-10-CM

## 2015-04-11 DIAGNOSIS — I25119 Atherosclerotic heart disease of native coronary artery with unspecified angina pectoris: Secondary | ICD-10-CM

## 2015-04-11 NOTE — Progress Notes (Signed)
Patient ID: Debra Spencer, female   DOB: [birth year], 81 y.o.   MRN: [id number]      Cardiology Office Note   Date:  04/11/2015   ID:  Debra Spencer, DOB 1927/12/17, MRN [id number]  PCP:  Glenda Chroman., MD  Cardiologist:   Sanda Klein, MD   Chief Complaint  Patient presents with  . Establish Care     chest pain, occassional shortness of breath, occassional edema in feet, has pain in legs, occassional cramping in legs, most of the time lightheadedness, most of the time dizziness      History of Present Illness: Debra Spencer is a 81 y.o. female who presents for  Complaints of chest pain.  She is accompanied by her daughter Debra Spencer.   her chest discomfort is not associated with physical activity. She does a lot of housework and works in the yard and never complaints of chest discomfort at that time. On the contrary, the discomfort often wakes her up at night. She has not noticed any particular precipitating or ameliorating factors. She describes her discomfort as a retrosternal dull ache without much radiation. She has occasional ankle puffiness that consistently resolves with overnight bed rest. She is occasional leg cramps and mild orthostatic dizziness symptoms. She does not have clear-cut focal neurological complaints other than being hard of hearing.   the electronic medical record lists a history of a "low risk exercise stress Cardiolite November 2006 suggestive of possible small area of apical ischemia ". There is a report of a cardiac catheterization performed by Dr. Nyoka Cowden [facility] November 2009 that showed minimal plaque with a 10-20 percent ostial LAD lesion. Neither the patient nor her daughter Debra Spencer recall her ever having had cardiac catheterization.  Her problem list also includes dyslipidemia intolerant to simvastatin and a history of nitrate intolerance. Again the patient does not recall taking either one of these medications.   in 2003 she had endoscopy and  esophageal dilatation with Dr. Gala Romney.  Had distal esophageal erosions with mild erosive reflux esophagitis, Schatzki ring status post dilation and a small hiatal hernia.  Past Medical History  Diagnosis Date  . Coronary atherosclerosis     mild; treated medically. neg asenosine stress cardiolite, EF 77% 6/08. low risk exercise stress cardiolite 11/06. sugg. of possib. small area  of apical ischemia. minor luminal irreg., 10-20% ostial LAD stenosis at cardiac cath 11/09.   Marland Kitchen Dyslipidemia     intolerant to simvastatin  . Cerebrovascular disease     nonobstructive. carotid dopplers, July 2008  . GERD (gastroesophageal reflux disease)   . Diverticulitis   . Drug intolerance     hx of nitrate intolerance   . Anxiety     hx     Past Surgical History  Procedure Laterality Date  . Appendectomy    . Cholecystectomy    . Tonsillectomy       Current Outpatient Prescriptions  Medication Sig Dispense Refill  . alendronate (FOSAMAX) 70 MG tablet Take 1 tablet by mouth once a week. Take 1 tab once a week    . aspirin 325 MG tablet Take 325 mg by mouth daily.      . Calcium-Vitamin D 600-200 MG-UNIT per tablet Take 1 tablet by mouth daily. 600-200?     Marland Kitchen meclizine (ANTIVERT) 25 MG tablet Take 1 tablet by mouth 2 (two) times daily. Take 1 tab tid    . metoprolol tartrate (LOPRESSOR) 25 MG tablet Take 25 mg by mouth daily.      Marland Kitchen  Multiple Vitamin (MULTIVITAMIN) tablet Take 1 tablet by mouth daily.      Marland Kitchen omeprazole (PRILOSEC) 20 MG capsule Take 20 mg by mouth daily.       No current facility-administered medications for this visit.    Allergies:   Amoxicillin; Erythromycin; and Nitrofurantoin    Social History:  The patient  reports that she has never smoked. She has never used smokeless tobacco. She reports that she does not drink alcohol.   Family History:  The patient's  Family history is reviewed and is not contributory. Her daughter Debra Spencer has diabetes  And has had a stroke (the latter  was probably paradoxical embolism )    ROS:  Please see the history of present illness.    Otherwise, review of systems positive for none.   All other systems are reviewed and negative.    PHYSICAL EXAM: VS:  BP 170/82 mmHg  Pulse 63  Resp 22  Ht 5\' 4"  (1.626 m)  Wt 149 lb 4.8 oz (67.722 kg)  BMI 25.61 kg/m2 , BMI Body mass index is 25.61 kg/(m^2).  General: Alert, oriented x3, no distress Head: no evidence of trauma, PERRL, EOMI, no exophtalmos or lid lag, no myxedema, no xanthelasma; normal ears, nose and oropharynx Neck: normal jugular venous pulsations and no hepatojugular reflux; brisk carotid pulses without delay and no carotid bruits Chest: clear to auscultation, no signs of consolidation by percussion or palpation, normal fremitus, symmetrical and full respiratory excursions Cardiovascular: normal position and quality of the apical impulse, regular rhythm, normal first and second heart sounds, no murmurs, rubs or gallops Abdomen: no tenderness or distention, no masses by palpation, no abnormal pulsatility or arterial bruits, normal bowel sounds, no hepatosplenomegaly Extremities: no clubbing, cyanosis or edema; 2+ radial, ulnar and brachial pulses bilaterally; 2+ right femoral, posterior tibial and dorsalis pedis pulses; 2+ left femoral, posterior tibial and dorsalis pedis pulses; no subclavian or femoral bruits Neurological: grossly nonfocal Psych: euthymic mood, full affect   EKG:  EKG is ordered today. The ekg ordered today demonstrates  Sinus rhythm, QS in leads V1-V3 consistent with old anteroseptal infarction, no acute repolarization abnormalities.  No old ECG tracings are available for review  I cannot locate any old tracings for comparison  Recent Labs: No results found for requested labs within last 365 days.    Lipid Panel No results found for: CHOL, TRIG, HDL, CHOLHDL, VLDL, LDLCALC, LDLDIRECT    Wt Readings from Last 3 Encounters:  04/11/15 149 lb 4.8 oz  (67.722 kg)      Other studies Reviewed: Additional studies/ records that were reviewed today include:  cardiac catheterization report from 2009.   ASSESSMENT AND PLAN:  1.  Atypical chest discomfort. Her symptoms occur at rest and predominantly when she is recumbent. I wonder whether they might be related to gastroesophageal reflux disease and/or bisphosphonate-related esophageal injury. On the other hand she is quite elderly and has an abnormal EKG suggestive of an old anteroseptal infarction. There is a coronary angiogram report in the computer system that has her name and correct date of birth on it. It reported mild coronary atherosclerosis with a 10-20 percent ostial LAD lesion and normal left ventricular systolic function. The patient does not recall ever having cardiac catheterization.  I have recommended a Lexiscan Myoview. She is unable to exercise on a treadmill due to joint complaints. If this study is normal I would pursue a possible gastroesophageal etiology for her symptoms, particular concern for Fosamax related esophageal ulcer in  this elderly patient with esophageal reflux.  2.  Essential hypertension. Her blood pressure was very high when she came in today but was gradually improving as time went by.  She had just come in from a stressful meeting at the bank regarding alone. Previous checks at home reportedly showed normal blood pressure.  We will have the opportunity to check her blood pressure repeatedly when she comes in for her stress test. No changes are made to her current medications.  3.   History of gastroesophageal reflux disease and previous esophageal dilatation for stricture.  I think an oral bisphosphonate might be very risky for this patient. Consider switching to alternative treatment for osteoporosis such as an injectable agent.  She definitely needs to be very careful when taking the Fosamax, drinking it down with a full glass of water and not life flat for several  hours after taking it.    Current medicines are reviewed at length with the patient today.  The patient does not have concerns regarding medicines.  The following changes have been made:  no change  Labs/ tests ordered today include:   Orders Placed This Encounter  Procedures  . Myocardial Perfusion Imaging  . EKG 12-Lead    Patient Instructions  Your physician has requested that you have a lexiscan myoview. For further information please visit [email or web address]. Please follow instruction sheet, as given.  WE WILL CALL YOU WITH THE RESULTS.  Dr. Sallyanne Kuster recommends that you schedule a follow-up appointment in: ONE YEAR       SignedSanda Klein, MD  04/11/2015 2:53 PM    Sanda Klein, MD, [facility] [phone] office [phone] pager

## 2015-04-11 NOTE — Patient Instructions (Signed)
Your physician has requested that you have a lexiscan myoview. For further information please visit [email or web address]. Please follow instruction sheet, as given.  WE WILL CALL YOU WITH THE RESULTS.  Dr. Croitoru recommends that you schedule a follow-up appointment in: ONE YEAR     

## 2015-04-18 ENCOUNTER — Telehealth (HOSPITAL_COMMUNITY): Payer: Self-pay

## 2015-04-18 NOTE — Telephone Encounter (Signed)
Encounter complete. 

## 2015-04-25 ENCOUNTER — Ambulatory Visit (HOSPITAL_COMMUNITY)
Admission: RE | Admit: 2015-04-25 | Discharge: 2015-04-25 | Disposition: A | Discharge status: Home / Self Care | Payer: Medicare Other | Source: Ambulatory Visit | Attending: Internal Medicine | Admitting: Internal Medicine

## 2015-04-25 DIAGNOSIS — R5383 Other fatigue: Secondary | ICD-10-CM | POA: Diagnosis not present

## 2015-04-25 DIAGNOSIS — I10 Essential (primary) hypertension: Secondary | ICD-10-CM | POA: Diagnosis not present

## 2015-04-25 DIAGNOSIS — R0602 Shortness of breath: Secondary | ICD-10-CM | POA: Insufficient documentation

## 2015-04-25 DIAGNOSIS — R42 Dizziness and giddiness: Secondary | ICD-10-CM | POA: Diagnosis not present

## 2015-04-25 DIAGNOSIS — Z8249 Family history of ischemic heart disease and other diseases of the circulatory system: Secondary | ICD-10-CM | POA: Diagnosis not present

## 2015-04-25 DIAGNOSIS — R079 Chest pain, unspecified: Secondary | ICD-10-CM

## 2015-04-25 LAB — MYOCARDIAL PERFUSION IMAGING
CHL CUP RESTING HR STRESS: 62 {beats}/min
CSEPPHR: 96 {beats}/min
LV sys vol: 20 mL
LVDIAVOL: 59 mL
NUC STRESS TID: 1.33
SDS: 2
SRS: 0
SSS: 2

## 2015-04-25 MED ORDER — TECHNETIUM TC 99M SESTAMIBI GENERIC - CARDIOLITE
10.5000 | Freq: Once | INTRAVENOUS | Status: AC | PRN
  Administered 2015-04-25: 10.5 via INTRAVENOUS

## 2015-04-25 MED ORDER — TECHNETIUM TC 99M SESTAMIBI GENERIC - CARDIOLITE
31.9000 | Freq: Once | INTRAVENOUS | Status: AC | PRN
  Administered 2015-04-25: 31.9 via INTRAVENOUS

## 2015-04-25 MED ORDER — AMINOPHYLLINE 25 MG/ML IV SOLN
75.0000 mg | Freq: Once | INTRAVENOUS | Status: AC
  Administered 2015-04-25: 75 mg via INTRAVENOUS

## 2015-04-25 MED ORDER — REGADENOSON 0.4 MG/5ML IV SOLN
0.4000 mg | Freq: Once | INTRAVENOUS | Status: AC
  Administered 2015-04-25: 0.4 mg via INTRAVENOUS

## 2015-05-29 DIAGNOSIS — E2839 Other primary ovarian failure: Secondary | ICD-10-CM | POA: Diagnosis not present

## 2015-05-29 DIAGNOSIS — M810 Age-related osteoporosis without current pathological fracture: Secondary | ICD-10-CM | POA: Diagnosis not present

## 2015-06-26 DIAGNOSIS — R42 Dizziness and giddiness: Secondary | ICD-10-CM | POA: Diagnosis not present

## 2015-06-26 DIAGNOSIS — H6121 Impacted cerumen, right ear: Secondary | ICD-10-CM | POA: Diagnosis not present

## 2015-06-26 DIAGNOSIS — H8111 Benign paroxysmal vertigo, right ear: Secondary | ICD-10-CM | POA: Diagnosis not present

## 2015-07-04 DIAGNOSIS — E785 Hyperlipidemia, unspecified: Secondary | ICD-10-CM | POA: Diagnosis not present

## 2015-07-04 DIAGNOSIS — R300 Dysuria: Secondary | ICD-10-CM | POA: Diagnosis not present

## 2015-07-04 DIAGNOSIS — R350 Frequency of micturition: Secondary | ICD-10-CM | POA: Diagnosis not present

## 2015-08-07 DIAGNOSIS — M47812 Spondylosis without myelopathy or radiculopathy, cervical region: Secondary | ICD-10-CM | POA: Diagnosis not present

## 2015-08-07 DIAGNOSIS — H8111 Benign paroxysmal vertigo, right ear: Secondary | ICD-10-CM | POA: Diagnosis not present

## 2015-08-07 DIAGNOSIS — M5030 Other cervical disc degeneration, unspecified cervical region: Secondary | ICD-10-CM | POA: Diagnosis not present

## 2015-08-07 DIAGNOSIS — M47817 Spondylosis without myelopathy or radiculopathy, lumbosacral region: Secondary | ICD-10-CM | POA: Diagnosis not present

## 2015-08-07 DIAGNOSIS — R42 Dizziness and giddiness: Secondary | ICD-10-CM | POA: Diagnosis not present

## 2015-08-07 DIAGNOSIS — M5126 Other intervertebral disc displacement, lumbar region: Secondary | ICD-10-CM | POA: Diagnosis not present

## 2015-08-11 DIAGNOSIS — Y9301 Activity, walking, marching and hiking: Secondary | ICD-10-CM | POA: Diagnosis not present

## 2015-08-11 DIAGNOSIS — Z043 Encounter for examination and observation following other accident: Secondary | ICD-10-CM | POA: Diagnosis not present

## 2015-08-11 DIAGNOSIS — W1830XA Fall on same level, unspecified, initial encounter: Secondary | ICD-10-CM | POA: Diagnosis not present

## 2015-08-11 DIAGNOSIS — R51 Headache: Secondary | ICD-10-CM | POA: Diagnosis not present

## 2015-08-11 DIAGNOSIS — I10 Essential (primary) hypertension: Secondary | ICD-10-CM | POA: Diagnosis not present

## 2015-08-11 DIAGNOSIS — M25552 Pain in left hip: Secondary | ICD-10-CM | POA: Diagnosis not present

## 2015-08-11 DIAGNOSIS — Y998 Other external cause status: Secondary | ICD-10-CM | POA: Diagnosis not present

## 2015-08-11 DIAGNOSIS — Y92481 Parking lot as the place of occurrence of the external cause: Secondary | ICD-10-CM | POA: Diagnosis not present

## 2015-08-11 DIAGNOSIS — Z7982 Long term (current) use of aspirin: Secondary | ICD-10-CM | POA: Diagnosis not present

## 2015-08-11 DIAGNOSIS — I2510 Atherosclerotic heart disease of native coronary artery without angina pectoris: Secondary | ICD-10-CM | POA: Diagnosis not present

## 2015-08-11 DIAGNOSIS — Z79899 Other long term (current) drug therapy: Secondary | ICD-10-CM | POA: Diagnosis not present

## 2015-08-11 DIAGNOSIS — M5030 Other cervical disc degeneration, unspecified cervical region: Secondary | ICD-10-CM | POA: Diagnosis not present

## 2015-08-11 DIAGNOSIS — M542 Cervicalgia: Secondary | ICD-10-CM | POA: Diagnosis not present

## 2015-08-16 DIAGNOSIS — Z1231 Encounter for screening mammogram for malignant neoplasm of breast: Secondary | ICD-10-CM | POA: Diagnosis not present

## 2015-09-04 DIAGNOSIS — H8111 Benign paroxysmal vertigo, right ear: Secondary | ICD-10-CM | POA: Diagnosis not present

## 2015-09-04 DIAGNOSIS — H903 Sensorineural hearing loss, bilateral: Secondary | ICD-10-CM | POA: Diagnosis not present

## 2015-09-14 DIAGNOSIS — H40013 Open angle with borderline findings, low risk, bilateral: Secondary | ICD-10-CM | POA: Diagnosis not present

## 2015-09-25 DIAGNOSIS — M47817 Spondylosis without myelopathy or radiculopathy, lumbosacral region: Secondary | ICD-10-CM | POA: Diagnosis not present

## 2015-10-02 DIAGNOSIS — I10 Essential (primary) hypertension: Secondary | ICD-10-CM | POA: Diagnosis not present

## 2015-10-18 DIAGNOSIS — M159 Polyosteoarthritis, unspecified: Secondary | ICD-10-CM | POA: Diagnosis not present

## 2015-10-18 DIAGNOSIS — E7800 Pure hypercholesterolemia, unspecified: Secondary | ICD-10-CM | POA: Diagnosis not present

## 2015-10-18 DIAGNOSIS — I10 Essential (primary) hypertension: Secondary | ICD-10-CM | POA: Diagnosis not present

## 2015-10-30 DIAGNOSIS — H401111 Primary open-angle glaucoma, right eye, mild stage: Secondary | ICD-10-CM | POA: Diagnosis not present

## 2015-10-30 DIAGNOSIS — H01003 Unspecified blepharitis right eye, unspecified eyelid: Secondary | ICD-10-CM | POA: Diagnosis not present

## 2015-10-30 DIAGNOSIS — H40013 Open angle with borderline findings, low risk, bilateral: Secondary | ICD-10-CM | POA: Diagnosis not present

## 2015-10-30 DIAGNOSIS — H1013 Acute atopic conjunctivitis, bilateral: Secondary | ICD-10-CM | POA: Diagnosis not present

## 2015-11-16 DIAGNOSIS — Z7189 Other specified counseling: Secondary | ICD-10-CM | POA: Diagnosis not present

## 2015-11-16 DIAGNOSIS — Z79899 Other long term (current) drug therapy: Secondary | ICD-10-CM | POA: Diagnosis not present

## 2015-11-16 DIAGNOSIS — Z0000 Encounter for general adult medical examination without abnormal findings: Secondary | ICD-10-CM | POA: Diagnosis not present

## 2015-11-16 DIAGNOSIS — Z299 Encounter for prophylactic measures, unspecified: Secondary | ICD-10-CM | POA: Diagnosis not present

## 2015-11-16 DIAGNOSIS — Z1211 Encounter for screening for malignant neoplasm of colon: Secondary | ICD-10-CM | POA: Diagnosis not present

## 2015-11-16 DIAGNOSIS — Z1389 Encounter for screening for other disorder: Secondary | ICD-10-CM | POA: Diagnosis not present

## 2015-11-16 DIAGNOSIS — Z6826 Body mass index (BMI) 26.0-26.9, adult: Secondary | ICD-10-CM | POA: Diagnosis not present

## 2015-11-16 DIAGNOSIS — R5383 Other fatigue: Secondary | ICD-10-CM | POA: Diagnosis not present

## 2015-11-19 DIAGNOSIS — M159 Polyosteoarthritis, unspecified: Secondary | ICD-10-CM | POA: Diagnosis not present

## 2015-11-19 DIAGNOSIS — I10 Essential (primary) hypertension: Secondary | ICD-10-CM | POA: Diagnosis not present

## 2015-11-19 DIAGNOSIS — E7800 Pure hypercholesterolemia, unspecified: Secondary | ICD-10-CM | POA: Diagnosis not present

## 2016-04-01 DIAGNOSIS — M5126 Other intervertebral disc displacement, lumbar region: Secondary | ICD-10-CM | POA: Diagnosis not present

## 2016-04-01 DIAGNOSIS — M47817 Spondylosis without myelopathy or radiculopathy, lumbosacral region: Secondary | ICD-10-CM | POA: Diagnosis not present

## 2016-04-15 DIAGNOSIS — H401231 Low-tension glaucoma, bilateral, mild stage: Secondary | ICD-10-CM | POA: Diagnosis not present

## 2016-04-15 DIAGNOSIS — H35371 Puckering of macula, right eye: Secondary | ICD-10-CM | POA: Diagnosis not present

## 2016-04-15 DIAGNOSIS — Z961 Presence of intraocular lens: Secondary | ICD-10-CM | POA: Diagnosis not present

## 2016-07-03 DIAGNOSIS — Z299 Encounter for prophylactic measures, unspecified: Secondary | ICD-10-CM | POA: Diagnosis not present

## 2016-07-03 DIAGNOSIS — M19041 Primary osteoarthritis, right hand: Secondary | ICD-10-CM | POA: Diagnosis not present

## 2016-07-03 DIAGNOSIS — E663 Overweight: Secondary | ICD-10-CM | POA: Diagnosis not present

## 2016-07-03 DIAGNOSIS — S6991XA Unspecified injury of right wrist, hand and finger(s), initial encounter: Secondary | ICD-10-CM | POA: Diagnosis not present

## 2016-07-03 DIAGNOSIS — Z713 Dietary counseling and surveillance: Secondary | ICD-10-CM | POA: Diagnosis not present

## 2016-07-03 DIAGNOSIS — M25531 Pain in right wrist: Secondary | ICD-10-CM | POA: Diagnosis not present

## 2016-09-09 DIAGNOSIS — H01003 Unspecified blepharitis right eye, unspecified eyelid: Secondary | ICD-10-CM | POA: Diagnosis not present

## 2016-09-09 DIAGNOSIS — H524 Presbyopia: Secondary | ICD-10-CM | POA: Diagnosis not present

## 2016-09-09 DIAGNOSIS — H1013 Acute atopic conjunctivitis, bilateral: Secondary | ICD-10-CM | POA: Diagnosis not present

## 2016-09-09 DIAGNOSIS — H401231 Low-tension glaucoma, bilateral, mild stage: Secondary | ICD-10-CM | POA: Diagnosis not present

## 2016-09-15 ENCOUNTER — Encounter: Payer: Self-pay | Admitting: Cardiovascular Disease

## 2016-09-15 ENCOUNTER — Ambulatory Visit (INDEPENDENT_AMBULATORY_CARE_PROVIDER_SITE_OTHER): Payer: PPO | Admitting: Cardiovascular Disease

## 2016-09-15 VITALS — BP 158/70 | HR 55 | Ht 64.0 in | Wt 138.0 lb

## 2016-09-15 DIAGNOSIS — K2100 Gastro-esophageal reflux disease with esophagitis, without bleeding: Secondary | ICD-10-CM

## 2016-09-15 DIAGNOSIS — I10 Essential (primary) hypertension: Secondary | ICD-10-CM | POA: Insufficient documentation

## 2016-09-15 DIAGNOSIS — M79604 Pain in right leg: Secondary | ICD-10-CM | POA: Diagnosis not present

## 2016-09-15 DIAGNOSIS — M79605 Pain in left leg: Secondary | ICD-10-CM | POA: Diagnosis not present

## 2016-09-15 DIAGNOSIS — I25119 Atherosclerotic heart disease of native coronary artery with unspecified angina pectoris: Secondary | ICD-10-CM | POA: Diagnosis not present

## 2016-09-15 MED ORDER — NITROGLYCERIN 0.4 MG SL SUBL: 0.4000 mg | SUBLINGUAL_TABLET | SUBLINGUAL | 3 refills | Status: DC | PRN

## 2016-09-15 MED ORDER — METOPROLOL TARTRATE 25 MG PO TABS: 25.0000 mg | ORAL_TABLET | Freq: Two times a day (BID) | ORAL | 11 refills | Status: DC

## 2016-09-15 NOTE — Patient Instructions (Signed)
Dr Sallyanne Kuster has recommended making the following medication changes: 1. INCREASE Metoprolol to 25 mg TWICE daily 2. TAKE Nitroglycerin as needed - place 1 tablet underneath tongue every 5 minutes as needed for chest pain  - be sitting/lying down when taking this medication - MAX 3 doses, seek medical treatment/evaluation if pain is not relieved with 3rd dose  Your physician has requested that you regularly monitor and record your blood pressure readings at home. Please use the same machine at the same time of day to check your readings and record them to bring to your follow-up visit.  Dr Sallyanne Kuster recommends that you schedule a follow-up appointment in 3 months.  If you need a refill on your cardiac medications before your next appointment, please call your pharmacy.  Nitroglycerin sublingual tablets What is this medicine? NITROGLYCERIN (nye troe GLI ser in) is a type of vasodilator. It relaxes blood vessels, increasing the blood and oxygen supply to your heart. This medicine is used to relieve chest pain caused by angina. It is also used to prevent chest pain before activities like climbing stairs, going outdoors in cold weather, or sexual activity. This medicine may be used for other purposes; ask your health care provider or pharmacist if you have questions. COMMON BRAND NAME(S): Nitroquick, Nitrostat, Nitrotab What should I tell my health care provider before I take this medicine? They need to know if you have any of these conditions: -anemia -head injury, recent stroke, or bleeding in the brain -liver disease -previous heart attack -an unusual or allergic reaction to nitroglycerin, other medicines, foods, dyes, or preservatives -pregnant or trying to get pregnant -breast-feeding How should I use this medicine? Take this medicine by mouth as needed. At the first sign of an angina attack (chest pain or tightness) place one tablet under your tongue. You can also take this medicine 5 to 10  minutes before an event likely to produce chest pain. Follow the directions on the prescription label. Let the tablet dissolve under the tongue. Do not swallow whole. Replace the dose if you accidentally swallow it. It will help if your mouth is not dry. Saliva around the tablet will help it to dissolve more quickly. Do not eat or drink, smoke or chew tobacco while a tablet is dissolving. If you are not better within 5 minutes after taking ONE dose of nitroglycerin, call 9-1-1 immediately to seek emergency medical care. Do not take more than 3 nitroglycerin tablets over 15 minutes. If you take this medicine often to relieve symptoms of angina, your doctor or health care professional may provide you with different instructions to manage your symptoms. If symptoms do not go away after following these instructions, it is important to call 9-1-1 immediately. Do not take more than 3 nitroglycerin tablets over 15 minutes. Talk to your pediatrician regarding the use of this medicine in children. Special care may be needed. Overdosage: If you think you have taken too much of this medicine contact a poison control center or emergency room at once. NOTE: This medicine is only for you. Do not share this medicine with others. What if I miss a dose? This does not apply. This medicine is only used as needed. What may interact with this medicine? Do not take this medicine with any of the following medications: -certain migraine medicines like ergotamine and dihydroergotamine (DHE) -medicines used to treat erectile dysfunction like sildenafil, tadalafil, and vardenafil -riociguat This medicine may also interact with the following medications: -alteplase -aspirin -heparin -medicines for high  blood pressure -medicines for mental depression -other medicines used to treat angina -phenothiazines like chlorpromazine, mesoridazine, prochlorperazine, thioridazine This list may not describe all possible interactions.  Give your health care provider a list of all the medicines, herbs, non-prescription drugs, or dietary supplements you use. Also tell them if you smoke, drink alcohol, or use illegal drugs. Some items may interact with your medicine. What should I watch for while using this medicine? Tell your doctor or health care professional if you feel your medicine is no longer working. Keep this medicine with you at all times. Sit or lie down when you take your medicine to prevent falling if you feel dizzy or faint after using it. Try to remain calm. This will help you to feel better faster. If you feel dizzy, take several deep breaths and lie down with your feet propped up, or bend forward with your head resting between your knees. You may get drowsy or dizzy. Do not drive, use machinery, or do anything that needs mental alertness until you know how this drug affects you. Do not stand or sit up quickly, especially if you are an older patient. This reduces the risk of dizzy or fainting spells. Alcohol can make you more drowsy and dizzy. Avoid alcoholic drinks. Do not treat yourself for coughs, colds, or pain while you are taking this medicine without asking your doctor or health care professional for advice. Some ingredients may increase your blood pressure. What side effects may I notice from receiving this medicine? Side effects that you should report to your doctor or health care professional as soon as possible: -blurred vision -dry mouth -skin rash -sweating -the feeling of extreme pressure in the head -unusually weak or tired Side effects that usually do not require medical attention (report to your doctor or health care professional if they continue or are bothersome): -flushing of the face or neck -headache -irregular heartbeat, palpitations -nausea, vomiting This list may not describe all possible side effects. Call your doctor for medical advice about side effects. You may report side effects to FDA  at 1-800-FDA-1088. Where should I keep my medicine? Keep out of the reach of children. Store at room temperature between 20 and 25 degrees C (68 and 77 degrees F). Store in [employer]. Protect from light and moisture. Keep tightly closed. Throw away any unused medicine after the expiration date. NOTE: This sheet is a summary. It may not cover all possible information. If you have questions about this medicine, talk to your doctor, pharmacist, or health care provider.  2018 [employer]/Gold Standard (2013-03-10 17:57:36)

## 2016-09-15 NOTE — Progress Notes (Addendum)
Cardiology Office Note    Date:  09/15/2016   ID:  [name], [name] [birth year], MRN [id number]  PCP:  Debra Chroman, MD  Cardiologist:   Debra Klein, MD   Chief Complaint  Patient presents with  . Follow-up    12 months  . Chest Pain    History of Present Illness:  Debra Spencer is a 83 y.o. female in with complaints of chest pain at rest.  She has had occasional episodes of a tightness in the retrosternal and precordial area that occurs randomly at rest. It does not appear to have any correlation to physical activity, but may have some correlation with emotional stress. Described as a dull ache, but sometimes wakes her up at night. Her husband has recently been diagnosed with pancreatic cancer, atrial fibrillation and congestive heart failure and rapid succession. He will actually be coming to see me as a new patient on Thursday.  She denies orthopnea or PND and has not had leg edema. She complains of leg pain especially when she lies in bed at night. She does not have any symptoms that sound like intermittent claudication. She denies focal neurological complaints, but is chronically very hard of hearing. Her blood pressures usually well-controlled but is elevated today even when I rechecked it. She has been taking the Toprol tartrate 50 mg once daily instead of 25 mg twice a day.  Most recent evaluation for coronary disease was in normal [facility] November 2016. Prior to that she had cardiac catheterization with Dr. Merlene Spencer in 2009 showing minimal plaque and a 10-20% ostial LAD stenosis. She has hyperlipidemia but was intolerant of simvastatin. There is a mention of a history of "nitrate intolerance". She does not recall taking either one of these medicines.  Pertinent to note that she has gastroesophageal reflux disease with a history of erosive esophagitis, Schatzki ring, a small hiatal hernia and esophageal dilatation in 2003. She takes low-dose proton pump  inhibitor therapy chronically, but is also taking a bisphosphonate weekly.   Past Medical History:  Diagnosis Date  . Anxiety    hx   . Cerebrovascular disease    nonobstructive. carotid dopplers, July 2008  . Coronary atherosclerosis    mild; treated medically. neg asenosine stress cardiolite, EF 77% 6/08. low risk exercise stress cardiolite 11/06. sugg. of possib. small area  of apical ischemia. minor luminal irreg., 10-20% ostial LAD stenosis at cardiac cath 11/09.   . Diverticulitis   . Drug intolerance    hx of nitrate intolerance   . Dyslipidemia    intolerant to simvastatin  . GERD (gastroesophageal reflux disease)     Past Surgical History:  Procedure Laterality Date  . APPENDECTOMY    . CHOLECYSTECTOMY    . TONSILLECTOMY      Current Medications: Outpatient Medications Prior to Visit  Medication Sig Dispense Refill  . alendronate (FOSAMAX) 70 MG tablet Take 1 tablet by mouth once a week. Take 1 tab once a week    . Calcium-Vitamin D 600-200 MG-UNIT per tablet Take 1 tablet by mouth daily. 600-200?     Marland Kitchen Multiple Vitamin (MULTIVITAMIN) tablet Take 1 tablet by mouth daily.      Marland Kitchen omeprazole (PRILOSEC) 20 MG capsule Take 20 mg by mouth daily.      Marland Kitchen aspirin 325 MG tablet Take 325 mg by mouth daily.      . meclizine (ANTIVERT) 25 MG tablet Take 1 tablet by mouth 2 (two) times daily. Take  1 tab tid    . metoprolol tartrate (LOPRESSOR) 25 MG tablet Take 25 mg by mouth daily.       No facility-administered medications prior to visit.      Allergies:   Amoxicillin; Erythromycin; and Nitrofurantoin   Social History   Social History  . Marital status: Married    Spouse name: N/A  . Number of children: N/A  . Years of education: N/A   Social History Main Topics  . Smoking status: Never Smoker  . Smokeless tobacco: Never Used     Comment: tobacco use - no   . Alcohol use No  . Drug use: Unknown  . Sexual activity: Not Asked   Other Topics Concern  . None    Social History Narrative   Married, retired.      Family History:  The patient's Daughter Debra Spencer has diabetes and has had a stroke, but this was probably related to paradoxical embolism).  ROS:   Please see the history of present illness.    ROS All other systems reviewed and are negative.   PHYSICAL EXAM:   VS:  BP (!) 158/70   Pulse (!) 55   Ht 5\' 4"  (1.626 m)   Wt 62.6 kg (138 lb)   BMI 23.69 kg/m    GEN: Well nourished, well developed, in no acute distress  HEENT: normal  Neck: no JVD, carotid bruits, or masses Cardiac: RRR; no murmurs, rubs, or gallops,no edema  Respiratory:  clear to auscultation bilaterally, normal work of breathing GI: soft, nontender, nondistended, + BS MS: no deformity or atrophy  Skin: warm and dry, no rash Neuro:  Alert and Oriented x 3, Strength and sensation are intact. Very hard of hearing Psych: euthymic mood, full affect  Wt Readings from Last 3 Encounters:  09/15/16 62.6 kg (138 lb)  04/25/15 67.6 kg (149 lb)  04/11/15 67.7 kg (149 lb 4.8 oz)      Studies/Labs Reviewed:   EKG:  EKG is ordered today.  The ekg ordered today demonstrates Mild sinus bradycardia, first-degree AV block, old QS pattern in leads V1-V2, no acute repolarization changes, QTc 357 ms. It has not changed from November 2016    ASSESSMENT:    1. Atherosclerosis of native coronary artery of native heart with angina pectoris ([name])   2. Gastroesophageal reflux disease with esophagitis   3. Bilateral leg pain   4. Essential hypertension      PLAN:  In order of problems listed above:  1. Possible atypical angina: May have symptoms related to emotional stress, consider coronary vasospasm. Had a normal nuclear stress test within the last 18 months (ordered for symptoms very similar to today's). Gave her prescription for sublingual nitroglycerin to use as empirical therapy. This may also relieve symptoms are actually related to esophageal spasm, either way would be  appropriate therapy. Will try long-acting nitrates if as needed therapy works. Note that her chart documents "intolerance to nitrates, but the patient and her daughter don't recall her ever taking these medicines. 2. GERD: Might be an alternative explanation for her symptoms. I would recommend stopping her bisphosphonate. 3. Leg pain: Is not related to activity and does not sound like intermittent claudication, but rather musculoskeletal pain/referred back pain 4. HTN: Mildly elevated systolic blood pressure. Note history of problems with orthostatic dizziness. Make sure she takes the metoprolol twice daily, record blood pressure daily and we will review the results when she comes with her husband this Thursday.    Medication  Adjustments/Labs and Tests Ordered: Current medicines are reviewed at length with the patient today.  Concerns regarding medicines are outlined above.  Medication changes, Labs and Tests ordered today are listed in the Patient Instructions below. Patient Instructions  Dr Sallyanne Kuster has recommended making the following medication changes: 1. INCREASE Metoprolol to 25 mg TWICE daily 2. TAKE Nitroglycerin as needed - place 1 tablet underneath tongue every 5 minutes as needed for chest pain  - be sitting/lying down when taking this medication - MAX 3 doses, seek medical treatment/evaluation if pain is not relieved with 3rd dose  Your physician has requested that you regularly monitor and record your blood pressure readings at home. Please use the same machine at the same time of day to check your readings and record them to bring to your follow-up visit.  Dr Sallyanne Kuster recommends that you schedule a follow-up appointment in 3 months.  If you need a refill on your cardiac medications before your next appointment, please call your pharmacy.  Nitroglycerin sublingual tablets What is this medicine? NITROGLYCERIN (nye troe GLI ser in) is a type of vasodilator. It relaxes blood vessels,  increasing the blood and oxygen supply to your heart. This medicine is used to relieve chest pain caused by angina. It is also used to prevent chest pain before activities like climbing stairs, going outdoors in cold weather, or sexual activity. This medicine may be used for other purposes; ask your health care provider or pharmacist if you have questions. COMMON BRAND NAME(S): Nitroquick, Nitrostat, Nitrotab What should I tell my health care provider before I take this medicine? They need to know if you have any of these conditions: -anemia -head injury, recent stroke, or bleeding in the brain -liver disease -previous heart attack -an unusual or allergic reaction to nitroglycerin, other medicines, foods, dyes, or preservatives -pregnant or trying to get pregnant -breast-feeding How should I use this medicine? Take this medicine by mouth as needed. At the first sign of an angina attack (chest pain or tightness) place one tablet under your tongue. You can also take this medicine 5 to 10 minutes before an event likely to produce chest pain. Follow the directions on the prescription label. Let the tablet dissolve under the tongue. Do not swallow whole. Replace the dose if you accidentally swallow it. It will help if your mouth is not dry. Saliva around the tablet will help it to dissolve more quickly. Do not eat or drink, smoke or chew tobacco while a tablet is dissolving. If you are not better within 5 minutes after taking ONE dose of nitroglycerin, call 9-1-1 immediately to seek emergency medical care. Do not take more than 3 nitroglycerin tablets over 15 minutes. If you take this medicine often to relieve symptoms of angina, your doctor or health care professional may provide you with different instructions to manage your symptoms. If symptoms do not go away after following these instructions, it is important to call 9-1-1 immediately. Do not take more than 3 nitroglycerin tablets over 15  minutes. Talk to your pediatrician regarding the use of this medicine in children. Special care may be needed. Overdosage: If you think you have taken too much of this medicine contact a poison control center or emergency room at once. NOTE: This medicine is only for you. Do not share this medicine with others. What if I miss a dose? This does not apply. This medicine is only used as needed. What may interact with this medicine? Do not take this medicine with  any of the following medications: -certain migraine medicines like ergotamine and dihydroergotamine (DHE) -medicines used to treat erectile dysfunction like sildenafil, tadalafil, and vardenafil -riociguat This medicine may also interact with the following medications: -alteplase -aspirin -heparin -medicines for high blood pressure -medicines for mental depression -other medicines used to treat angina -phenothiazines like chlorpromazine, mesoridazine, prochlorperazine, thioridazine This list may not describe all possible interactions. Give your health care provider a list of all the medicines, herbs, non-prescription drugs, or dietary supplements you use. Also tell them if you smoke, drink alcohol, or use illegal drugs. Some items may interact with your medicine. What should I watch for while using this medicine? Tell your doctor or health care professional if you feel your medicine is no longer working. Keep this medicine with you at all times. Sit or lie down when you take your medicine to prevent falling if you feel dizzy or faint after using it. Try to remain calm. This will help you to feel better faster. If you feel dizzy, take several deep breaths and lie down with your feet propped up, or bend forward with your head resting between your knees. You may get drowsy or dizzy. Do not drive, use machinery, or do anything that needs mental alertness until you know how this drug affects you. Do not stand or sit up quickly, especially if  you are an older patient. This reduces the risk of dizzy or fainting spells. Alcohol can make you more drowsy and dizzy. Avoid alcoholic drinks. Do not treat yourself for coughs, colds, or pain while you are taking this medicine without asking your doctor or health care professional for advice. Some ingredients may increase your blood pressure. What side effects may I notice from receiving this medicine? Side effects that you should report to your doctor or health care professional as soon as possible: -blurred vision -dry mouth -skin rash -sweating -the feeling of extreme pressure in the head -unusually weak or tired Side effects that usually do not require medical attention (report to your doctor or health care professional if they continue or are bothersome): -flushing of the face or neck -headache -irregular heartbeat, palpitations -nausea, vomiting This list may not describe all possible side effects. Call your doctor for medical advice about side effects. You may report side effects to FDA at 1-800-FDA-1088. Where should I keep my medicine? Keep out of the reach of children. Store at room temperature between 20 and 25 degrees C (68 and 77 degrees F). Store in Chief of Staff. Protect from light and moisture. Keep tightly closed. Throw away any unused medicine after the expiration date. NOTE: This sheet is a summary. It may not cover all possible information. If you have questions about this medicine, talk to your doctor, pharmacist, or health care provider.  2018 Elsevier/Gold Standard (2013-03-10 17:57:36)     Signed, Debra Klein, MD  09/15/2016 2:34 PM    [address], [place],   [address] Phone: [phone]; Fax: [phone]

## 2016-09-16 ENCOUNTER — Telehealth: Payer: Self-pay | Admitting: Cardiovascular Disease

## 2016-09-16 MED ORDER — METOPROLOL TARTRATE 50 MG PO TABS: 50.0000 mg | ORAL_TABLET | Freq: Two times a day (BID) | ORAL | 11 refills | Status: DC

## 2016-09-16 NOTE — Telephone Encounter (Signed)
New message    Pt daughter is calling.  Pt c/o medication issue:  1. Name of Medication: Metoprolol   2. How are you currently taking this medication (dosage and times per day)? 100 mg in the morning  3. Are you having a reaction (difficulty breathing--STAT)? no  4. What is your medication issue? Pt daughter is calling because a new script was called in for 25 mg once in the morning and once in the evening. She is calling to find out if this is correct.

## 2016-09-16 NOTE — Telephone Encounter (Signed)
We had wrong info about her home Rx. Please clarify if the metorpolol they have at home is succinate or tartrate. - if succinate, continue 100 mg daily - if tartrate, please take 50 mg twice daily.  And send appropriate Rx to pharmacy and correct in EPIC please

## 2016-09-16 NOTE — Telephone Encounter (Signed)
Discussed w patient's daughter, who acknowledges metoprolol tartrate at 100mg  daily dose.   Informed her we will divide into 50mg  BID dosing and re-send Rx to pharmacy as requested. She voiced understanding and thanks.

## 2016-09-16 NOTE — Telephone Encounter (Signed)
Called the patient back. No voicemail was available. The line was disconnected.

## 2016-09-16 NOTE — Telephone Encounter (Signed)
Returned the phone call. Per the patient and her daughter she had been taking 100 mg of Metoprolol every morning. The new prescription states that she should take 25 mg twice daily. Did not see in the patient's chart where she was supposed to take 100 mg metoprolol in the past. Will route to the physician for clarification.

## 2016-10-07 DIAGNOSIS — Z1231 Encounter for screening mammogram for malignant neoplasm of breast: Secondary | ICD-10-CM | POA: Diagnosis not present

## 2016-11-11 DIAGNOSIS — M47817 Spondylosis without myelopathy or radiculopathy, lumbosacral region: Secondary | ICD-10-CM | POA: Diagnosis not present

## 2016-11-11 DIAGNOSIS — M47816 Spondylosis without myelopathy or radiculopathy, lumbar region: Secondary | ICD-10-CM | POA: Diagnosis not present

## 2016-11-11 DIAGNOSIS — I700 Atherosclerosis of aorta: Secondary | ICD-10-CM | POA: Diagnosis not present

## 2016-11-20 DIAGNOSIS — E785 Hyperlipidemia, unspecified: Secondary | ICD-10-CM | POA: Diagnosis not present

## 2016-11-20 DIAGNOSIS — Z6825 Body mass index (BMI) 25.0-25.9, adult: Secondary | ICD-10-CM | POA: Diagnosis not present

## 2016-11-20 DIAGNOSIS — R5383 Other fatigue: Secondary | ICD-10-CM | POA: Diagnosis not present

## 2016-11-20 DIAGNOSIS — Z299 Encounter for prophylactic measures, unspecified: Secondary | ICD-10-CM | POA: Diagnosis not present

## 2016-11-20 DIAGNOSIS — Z7189 Other specified counseling: Secondary | ICD-10-CM | POA: Diagnosis not present

## 2016-11-20 DIAGNOSIS — Z79899 Other long term (current) drug therapy: Secondary | ICD-10-CM | POA: Diagnosis not present

## 2016-11-20 DIAGNOSIS — I10 Essential (primary) hypertension: Secondary | ICD-10-CM | POA: Diagnosis not present

## 2016-11-20 DIAGNOSIS — M810 Age-related osteoporosis without current pathological fracture: Secondary | ICD-10-CM | POA: Diagnosis not present

## 2016-11-20 DIAGNOSIS — Z0000 Encounter for general adult medical examination without abnormal findings: Secondary | ICD-10-CM | POA: Diagnosis not present

## 2016-11-20 DIAGNOSIS — Z1389 Encounter for screening for other disorder: Secondary | ICD-10-CM | POA: Diagnosis not present

## 2016-11-20 DIAGNOSIS — Z1211 Encounter for screening for malignant neoplasm of colon: Secondary | ICD-10-CM | POA: Diagnosis not present

## 2016-11-28 DIAGNOSIS — M47817 Spondylosis without myelopathy or radiculopathy, lumbosacral region: Secondary | ICD-10-CM | POA: Diagnosis not present

## 2016-12-08 DIAGNOSIS — M5126 Other intervertebral disc displacement, lumbar region: Secondary | ICD-10-CM | POA: Diagnosis not present

## 2016-12-08 DIAGNOSIS — M79605 Pain in left leg: Secondary | ICD-10-CM | POA: Diagnosis not present

## 2016-12-08 DIAGNOSIS — K838 Other specified diseases of biliary tract: Secondary | ICD-10-CM | POA: Diagnosis not present

## 2016-12-08 DIAGNOSIS — M47817 Spondylosis without myelopathy or radiculopathy, lumbosacral region: Secondary | ICD-10-CM | POA: Diagnosis not present

## 2016-12-10 ENCOUNTER — Other Ambulatory Visit: Payer: Self-pay | Admitting: Neurosurgery

## 2016-12-10 DIAGNOSIS — M5126 Other intervertebral disc displacement, lumbar region: Secondary | ICD-10-CM

## 2016-12-16 ENCOUNTER — Ambulatory Visit
Admission: RE | Admit: 2016-12-16 | Discharge: 2016-12-16 | Disposition: A | Discharge status: Home / Self Care | Payer: PPO | Source: Ambulatory Visit | Attending: Neurosurgery | Admitting: Neurosurgery

## 2016-12-16 ENCOUNTER — Ambulatory Visit: Payer: PPO | Admitting: Cardiovascular Disease

## 2016-12-16 DIAGNOSIS — M5126 Other intervertebral disc displacement, lumbar region: Secondary | ICD-10-CM

## 2016-12-16 DIAGNOSIS — M545 Low back pain: Secondary | ICD-10-CM | POA: Diagnosis not present

## 2016-12-16 MED ORDER — METHYLPREDNISOLONE ACETATE 40 MG/ML INJ SUSP (RADIOLOG
120.0000 mg | Freq: Once | INTRAMUSCULAR | Status: AC
  Administered 2016-12-16: 120 mg via EPIDURAL

## 2016-12-16 MED ORDER — IOPAMIDOL (ISOVUE-M 200) INJECTION 41%
1.0000 mL | Freq: Once | INTRAMUSCULAR | Status: AC
  Administered 2016-12-16: 1 mL via EPIDURAL

## 2016-12-16 NOTE — Discharge Instructions (Signed)

## 2016-12-24 HISTORY — PX: BACK SURGERY: SHX140

## 2016-12-25 DIAGNOSIS — M47817 Spondylosis without myelopathy or radiculopathy, lumbosacral region: Secondary | ICD-10-CM | POA: Diagnosis not present

## 2016-12-25 DIAGNOSIS — M5126 Other intervertebral disc displacement, lumbar region: Secondary | ICD-10-CM | POA: Diagnosis not present

## 2016-12-31 DIAGNOSIS — Z01818 Encounter for other preprocedural examination: Secondary | ICD-10-CM | POA: Diagnosis not present

## 2017-01-02 DIAGNOSIS — E785 Hyperlipidemia, unspecified: Secondary | ICD-10-CM | POA: Diagnosis not present

## 2017-01-02 DIAGNOSIS — M1990 Unspecified osteoarthritis, unspecified site: Secondary | ICD-10-CM | POA: Diagnosis not present

## 2017-01-02 DIAGNOSIS — M810 Age-related osteoporosis without current pathological fracture: Secondary | ICD-10-CM | POA: Diagnosis not present

## 2017-01-02 DIAGNOSIS — M47816 Spondylosis without myelopathy or radiculopathy, lumbar region: Secondary | ICD-10-CM | POA: Diagnosis not present

## 2017-01-02 DIAGNOSIS — K649 Unspecified hemorrhoids: Secondary | ICD-10-CM | POA: Diagnosis not present

## 2017-01-02 DIAGNOSIS — M48061 Spinal stenosis, lumbar region without neurogenic claudication: Secondary | ICD-10-CM | POA: Diagnosis not present

## 2017-01-02 DIAGNOSIS — I10 Essential (primary) hypertension: Secondary | ICD-10-CM | POA: Diagnosis not present

## 2017-01-02 DIAGNOSIS — K219 Gastro-esophageal reflux disease without esophagitis: Secondary | ICD-10-CM | POA: Diagnosis not present

## 2017-01-02 DIAGNOSIS — Z888 Allergy status to other drugs, medicaments and biological substances status: Secondary | ICD-10-CM | POA: Diagnosis not present

## 2017-01-02 DIAGNOSIS — M5126 Other intervertebral disc displacement, lumbar region: Secondary | ICD-10-CM | POA: Diagnosis not present

## 2017-01-02 DIAGNOSIS — Z7982 Long term (current) use of aspirin: Secondary | ICD-10-CM | POA: Diagnosis not present

## 2017-01-02 DIAGNOSIS — Z881 Allergy status to other antibiotic agents status: Secondary | ICD-10-CM | POA: Diagnosis not present

## 2017-01-02 DIAGNOSIS — K5790 Diverticulosis of intestine, part unspecified, without perforation or abscess without bleeding: Secondary | ICD-10-CM | POA: Diagnosis not present

## 2017-01-02 DIAGNOSIS — M4800 Spinal stenosis, site unspecified: Secondary | ICD-10-CM | POA: Diagnosis not present

## 2017-01-02 DIAGNOSIS — K5900 Constipation, unspecified: Secondary | ICD-10-CM | POA: Diagnosis not present

## 2017-01-02 DIAGNOSIS — M4326 Fusion of spine, lumbar region: Secondary | ICD-10-CM | POA: Diagnosis not present

## 2017-01-02 DIAGNOSIS — Z79899 Other long term (current) drug therapy: Secondary | ICD-10-CM | POA: Diagnosis not present

## 2017-01-02 DIAGNOSIS — R112 Nausea with vomiting, unspecified: Secondary | ICD-10-CM | POA: Diagnosis not present

## 2017-01-02 DIAGNOSIS — Z823 Family history of stroke: Secondary | ICD-10-CM | POA: Diagnosis not present

## 2017-01-02 DIAGNOSIS — G4700 Insomnia, unspecified: Secondary | ICD-10-CM | POA: Diagnosis not present

## 2017-01-02 DIAGNOSIS — R51 Headache: Secondary | ICD-10-CM | POA: Diagnosis not present

## 2017-01-02 DIAGNOSIS — Z8249 Family history of ischemic heart disease and other diseases of the circulatory system: Secondary | ICD-10-CM | POA: Diagnosis not present

## 2017-01-02 DIAGNOSIS — M4726 Other spondylosis with radiculopathy, lumbar region: Secondary | ICD-10-CM | POA: Diagnosis not present

## 2017-01-14 DIAGNOSIS — M5126 Other intervertebral disc displacement, lumbar region: Secondary | ICD-10-CM | POA: Diagnosis not present

## 2017-01-14 DIAGNOSIS — Z9889 Other specified postprocedural states: Secondary | ICD-10-CM | POA: Diagnosis not present

## 2017-01-14 DIAGNOSIS — M419 Scoliosis, unspecified: Secondary | ICD-10-CM | POA: Diagnosis not present

## 2017-01-14 DIAGNOSIS — I700 Atherosclerosis of aorta: Secondary | ICD-10-CM | POA: Diagnosis not present

## 2017-01-14 DIAGNOSIS — M5136 Other intervertebral disc degeneration, lumbar region: Secondary | ICD-10-CM | POA: Diagnosis not present

## 2017-01-14 DIAGNOSIS — M4716 Other spondylosis with myelopathy, lumbar region: Secondary | ICD-10-CM | POA: Diagnosis not present

## 2017-01-16 ENCOUNTER — Encounter: Payer: Self-pay | Admitting: *Deleted

## 2017-01-16 ENCOUNTER — Other Ambulatory Visit: Payer: Self-pay | Admitting: *Deleted

## 2017-01-16 NOTE — Patient Outreach (Signed)
Referral received from insurance company / [employer]- telephone call to pt for screening, spoke with pt, HIPAA verified, pt reports she was in [facility] for back surgery "several weeks ago"  Pt reports her spouse passed away few months ago and adult children assist (daughter and grand daughter).  Pt states she has all medications and takes as prescribed and is able to afford medications.  Pt states she is recovering from back surgery without any issues and to follow up with Dr. Carloyn Manner, pt states she has not been released to drive yet.  Pt states " I don't need a thing but thank you for calling"  Rn CM sent successful outreach letter to patient's home.  Jacqlyn Larsen [facility], [place] Coordinator [phone]

## 2017-01-21 DIAGNOSIS — Z6824 Body mass index (BMI) 24.0-24.9, adult: Secondary | ICD-10-CM | POA: Diagnosis not present

## 2017-01-21 DIAGNOSIS — I10 Essential (primary) hypertension: Secondary | ICD-10-CM | POA: Diagnosis not present

## 2017-01-21 DIAGNOSIS — Z299 Encounter for prophylactic measures, unspecified: Secondary | ICD-10-CM | POA: Diagnosis not present

## 2017-01-21 DIAGNOSIS — Z09 Encounter for follow-up examination after completed treatment for conditions other than malignant neoplasm: Secondary | ICD-10-CM | POA: Diagnosis not present

## 2017-01-21 DIAGNOSIS — K5790 Diverticulosis of intestine, part unspecified, without perforation or abscess without bleeding: Secondary | ICD-10-CM | POA: Diagnosis not present

## 2017-01-21 DIAGNOSIS — E785 Hyperlipidemia, unspecified: Secondary | ICD-10-CM | POA: Diagnosis not present

## 2017-01-21 DIAGNOSIS — Z713 Dietary counseling and surveillance: Secondary | ICD-10-CM | POA: Diagnosis not present

## 2017-01-21 DIAGNOSIS — M545 Low back pain: Secondary | ICD-10-CM | POA: Diagnosis not present

## 2017-01-21 DIAGNOSIS — K219 Gastro-esophageal reflux disease without esophagitis: Secondary | ICD-10-CM | POA: Diagnosis not present

## 2017-01-21 DIAGNOSIS — R110 Nausea: Secondary | ICD-10-CM | POA: Diagnosis not present

## 2017-02-11 ENCOUNTER — Encounter: Payer: Self-pay | Admitting: Nurse Practitioner

## 2017-02-11 ENCOUNTER — Ambulatory Visit (INDEPENDENT_AMBULATORY_CARE_PROVIDER_SITE_OTHER): Payer: PPO | Admitting: Nurse Practitioner

## 2017-02-11 VITALS — BP 152/67 | HR 59 | Temp 98.2°F | Ht 64.0 in | Wt 138.6 lb

## 2017-02-11 DIAGNOSIS — R634 Abnormal weight loss: Secondary | ICD-10-CM | POA: Diagnosis not present

## 2017-02-11 DIAGNOSIS — R197 Diarrhea, unspecified: Secondary | ICD-10-CM | POA: Insufficient documentation

## 2017-02-11 DIAGNOSIS — R110 Nausea: Secondary | ICD-10-CM

## 2017-02-11 DIAGNOSIS — R112 Nausea with vomiting, unspecified: Secondary | ICD-10-CM | POA: Diagnosis not present

## 2017-02-11 MED ORDER — ONDANSETRON HCL 4 MG PO TABS: 4.0000 mg | ORAL_TABLET | Freq: Three times a day (TID) | ORAL | 1 refills | Status: DC | PRN

## 2017-02-11 NOTE — Progress Notes (Signed)
Primary Care Physician:  Glenda Chroman, MD Primary Gastroenterologist:  Dr. Gala Romney  Chief Complaint  Patient presents with  . Nausea  . Diarrhea    HPI:   Debra Spencer is a 83 y.o. female who presents On referral from primary care for refractory nausea. The patient was last seen by primary care on 01/21/2017 for transition from hospital visit. The patient recently underwent spinal fusion. She was noted to be off her pain medicine but continued with nausea. Noted to be on omeprazole which was continued. Referred to GI for EGD and CT scan of the abdomen if needed.  Today she is accompanied by her daughter. Today she states she just had spinal fusion and has had persistent nausea for a number of years, became worse after surgery with pain medications. Since stopping her pain medications her nausea has persisted. States her appetite has decreased. Has had some weight loss of about 15-20 lbs in the past 6 months but has had some ongoing stress due to husband's illness (pancreatic cancer) and her spinal surgery. Denies vomiting. Denies abdominal pain, hematochezia, melena, fever, chills. Diarrhea started yesterday, has had chronic "problems with my bowels." Has had about 5 watery stools in the past 24 hours. Occasional GERD symptoms, on Omeprazole 20 mg daily. Denies chest pain, dyspnea, dizziness, lightheadedness, syncope, near syncope. Denies any other upper or lower GI symptoms.  Last colonoscopy at [facility], told it was normal.  Past Medical History:  Diagnosis Date  . Anxiety    hx   . Cerebrovascular disease    nonobstructive. carotid dopplers, July 2008  . Coronary atherosclerosis    mild; treated medically. neg asenosine stress cardiolite, EF 77% 6/08. low risk exercise stress cardiolite 11/06. sugg. of possib. small area  of apical ischemia. minor luminal irreg., 10-20% ostial LAD stenosis at cardiac cath 11/09.   . Diverticulitis   . Drug intolerance    hx of nitrate intolerance    . Dyslipidemia    intolerant to simvastatin  . GERD (gastroesophageal reflux disease)     Past Surgical History:  Procedure Laterality Date  . APPENDECTOMY    . BACK SURGERY  12/2016   L4-L5  . CHOLECYSTECTOMY    . TONSILLECTOMY      Current Outpatient Prescriptions  Medication Sig Dispense Refill  . alendronate (FOSAMAX) 70 MG tablet Take 1 tablet by mouth once a week. Take 1 tab once a week    . ASPIRIN 81 PO Take 81 mg by mouth daily.    . Calcium-Vitamin D 600-200 MG-UNIT per tablet Take 1 tablet by mouth daily. 600-200?     Marland Kitchen metoprolol tartrate (LOPRESSOR) 50 MG tablet Take 1 tablet (50 mg total) by mouth 2 (two) times daily. 60 tablet 11  . Multiple Vitamin (MULTIVITAMIN) tablet Take 1 tablet by mouth daily.      . Omega-3 Fatty Acids (FISH OIL PO) Take by mouth.    Marland Kitchen omeprazole (PRILOSEC) 20 MG capsule Take 20 mg by mouth daily.      . nitroGLYCERIN (NITROSTAT) 0.4 MG SL tablet Place 1 tablet (0.4 mg total) under the tongue every 5 (five) minutes as needed for chest pain. 25 tablet 3   No current facility-administered medications for this visit.     Allergies as of 02/11/2017 - Review Complete 02/11/2017  Allergen Reaction Noted  . Amoxicillin Nausea And Vomiting   . Erythromycin Nausea And Vomiting   . Nitrofurantoin Nausea And Vomiting     Family History  Problem Relation Age of Onset  . Colon cancer Neg Hx     Social History   Social History  . Marital status: Married    Spouse name: N/A  . Number of children: N/A  . Years of education: N/A   Occupational History  . Not on file.   Social History Main Topics  . Smoking status: Never Smoker  . Smokeless tobacco: Never Used     Comment: tobacco use - no   . Alcohol use No  . Drug use: No  . Sexual activity: Not on file   Other Topics Concern  . Not on file   Social History Narrative   Married, retired.     Review of Systems: General: Negative for anorexia, weight loss, fever, chills,  fatigue, weakness. ENT: Negative for hoarseness, difficulty swallowing. CV: Negative for chest pain, angina, palpitations, peripheral edema.  Respiratory: Negative for dyspnea at rest, cough, sputum, wheezing.  GI: See history of present illness. MS: Recovered well post-op, some back pain and wears a brace for the next few weeks.  Derm: Negative for rash or itching.  Endo: Negative for unusual weight change.  Heme: Negative for bruising or bleeding. Allergy: Negative for rash or hives.    Physical Exam: BP (!) 152/67   Pulse (!) 59   Temp 98.2 F (36.8 C) (Oral)   Ht 5\' 4"  (1.626 m)   Wt 138 lb 9.6 oz (62.9 kg)   BMI 23.79 kg/m  General:   Alert and oriented. Pleasant and cooperative. Well-nourished and well-developed.  Head:  Normocephalic and atraumatic. Eyes:  Without icterus, sclera clear and conjunctiva pink.  Ears:  Hard of hearing. Cardiovascular:  S1, S2 present without murmurs appreciated. Extremities without clubbing or edema. Respiratory:  Clear to auscultation bilaterally. No wheezes, rales, or rhonchi. No distress.  Gastrointestinal:  +BS, soft, and non-distended. Mild lower abdominal TTP. No HSM noted. No guarding or rebound. No masses appreciated.  Rectal:  Deferred  Musculoskalatal:  Symmetrical without gross deformities. Neurologic:  Alert and oriented x4;  grossly normal neurologically. Psych:  Alert and cooperative. Normal mood and affect. Heme/Lymph/Immune: No excessive bruising noted.    02/11/2017 10:58 AM   Disclaimer: This note was dictated with voice recognition software. Similar sounding words can inadvertently be transcribed and may not be corrected upon review.

## 2017-02-11 NOTE — Assessment & Plan Note (Signed)
The patient describes chronic low level non-bothersome nausea for a number of years. When she was admitted for disc rupture and necessary spinal fusion surgery her nausea increased significantly. Denies vomiting. Since she has been discharged and is off her pain medicines her nausea is mildly improved but still quite persistent. In addition to her weight loss and diarrhea this is somewhat concerning. I will check labs as per below, CT of the abdomen and pelvis as per below and follow-up in 4-6 weeks to consider colonoscopy and upper endoscopy. In the meantime I will increase her omeprazole to twice a day and send Zofran to her pharmacy to help with symptomatic management.

## 2017-02-11 NOTE — Progress Notes (Signed)
CC'ED TO PCP 

## 2017-02-11 NOTE — Assessment & Plan Note (Signed)
The patient has had about 15-20 pound weight loss in the past 6 months. She has been under some stress with her husband's recent illness and passing as well as her recent surgery for spinal fusion. She is not had a colonoscopy in a number of years due to her age. I will check labs including CBC, CMP, and stool studies to workup her diarrhea as per above. Given weight loss and persistent nausea I will order a CT of the abdomen and pelvis to further evaluate for any obvious pathology. I will have her follow-up in 4-6 weeks at which point we can track her weight, review her symptoms, and consider possible upper endoscopy and/or colonoscopy when she has had time to heal more from her spinal fusion to allow her to have these procedures.

## 2017-02-11 NOTE — Patient Instructions (Signed)
1. Have your labs completed when you're able to. 2. Bring your stool samples to the lab, not our office. 3. I have sent in Zofran 4 mg that you can take every 8 hours as needed for nausea. 4. You can continue to use your other nausea medicine at home. 5. I've increased your omeprazole acid blocker to twice a day. 6. We will schedule your CT of your abdomen and pelvis for you. 7. Follow-up in 4-6 weeks. 8. Call if you have any questions or concerns.

## 2017-02-11 NOTE — Assessment & Plan Note (Signed)
The patient has just recently developed diarrhea. This is concerning given that she has been admitted to the hospital recently for surgery. I will check stool studies to ensure she is not picked up a hospital-acquired infection. Further management based on stool studies and CT results.

## 2017-02-12 ENCOUNTER — Telehealth: Payer: Self-pay | Admitting: Nurse Practitioner

## 2017-02-12 ENCOUNTER — Ambulatory Visit (HOSPITAL_COMMUNITY)
Admission: RE | Admit: 2017-02-12 | Discharge: 2017-02-12 | Disposition: A | Discharge status: Home / Self Care | Payer: PPO | Source: Ambulatory Visit | Attending: Nurse Practitioner | Admitting: Nurse Practitioner

## 2017-02-12 DIAGNOSIS — R110 Nausea: Secondary | ICD-10-CM | POA: Insufficient documentation

## 2017-02-12 DIAGNOSIS — R197 Diarrhea, unspecified: Secondary | ICD-10-CM | POA: Insufficient documentation

## 2017-02-12 DIAGNOSIS — R112 Nausea with vomiting, unspecified: Secondary | ICD-10-CM | POA: Diagnosis not present

## 2017-02-12 DIAGNOSIS — R634 Abnormal weight loss: Secondary | ICD-10-CM | POA: Insufficient documentation

## 2017-02-12 DIAGNOSIS — R918 Other nonspecific abnormal finding of lung field: Secondary | ICD-10-CM | POA: Diagnosis not present

## 2017-02-12 DIAGNOSIS — R1110 Vomiting, unspecified: Secondary | ICD-10-CM | POA: Diagnosis not present

## 2017-02-12 LAB — POCT I-STAT CREATININE: CREATININE: 0.9 mg/dL (ref 0.44–1.00)

## 2017-02-12 MED ORDER — OMEPRAZOLE 20 MG PO CPDR: 20.0000 mg | DELAYED_RELEASE_CAPSULE | Freq: Two times a day (BID) | ORAL | 5 refills | Status: DC

## 2017-02-12 MED ORDER — IOPAMIDOL (ISOVUE-300) INJECTION 61%
100.0000 mL | Freq: Once | INTRAVENOUS | Status: AC | PRN
  Administered 2017-02-12: 100 mL via INTRAVENOUS

## 2017-02-12 NOTE — Telephone Encounter (Signed)
Please tell the patient in a refill with twice a day dosing was sent to her pharmacy.

## 2017-02-12 NOTE — Telephone Encounter (Signed)
PT's daughter is aware.  

## 2017-02-12 NOTE — Telephone Encounter (Signed)
-----   Message from Everardo All, LPN sent at 0/31/2811 12:23 PM EDT ----- Regarding: RX for bid dosing I almost forgot, Randall Hiss. But this pt said yesterday at discharge she would like for you to send in the prescription for the PPI for the bid dosing since you had increased it. Thanks!

## 2017-02-13 DIAGNOSIS — R110 Nausea: Secondary | ICD-10-CM | POA: Diagnosis not present

## 2017-02-13 DIAGNOSIS — R197 Diarrhea, unspecified: Secondary | ICD-10-CM | POA: Diagnosis not present

## 2017-02-13 DIAGNOSIS — R112 Nausea with vomiting, unspecified: Secondary | ICD-10-CM | POA: Diagnosis not present

## 2017-02-13 DIAGNOSIS — R634 Abnormal weight loss: Secondary | ICD-10-CM | POA: Diagnosis not present

## 2017-02-16 LAB — GASTROINTESTINAL PATHOGEN PANEL PCR
C. DIFFICILE TOX A/B, PCR: NOT DETECTED
CAMPYLOBACTER, PCR: NOT DETECTED
Cryptosporidium, PCR: NOT DETECTED
E COLI (ETEC) LT/ST, PCR: NOT DETECTED
E COLI 0157, PCR: NOT DETECTED
E coli (STEC) stx1/stx2, PCR: NOT DETECTED
Giardia lamblia, PCR: NOT DETECTED
NOROVIRUS, PCR: NOT DETECTED
ROTAVIRUS, PCR: NOT DETECTED
Salmonella, PCR: NOT DETECTED
Shigella, PCR: NOT DETECTED

## 2017-02-16 LAB — COMPREHENSIVE METABOLIC PANEL
AG RATIO: 1.5 (calc) (ref 1.0–2.5)
ALKALINE PHOSPHATASE (APISO): 67 U/L (ref 33–130)
ALT: 12 U/L (ref 6–29)
AST: 17 U/L (ref 10–35)
Albumin: 3.7 g/dL (ref 3.6–5.1)
BILIRUBIN TOTAL: 0.4 mg/dL (ref 0.2–1.2)
BUN: 15 mg/dL (ref 7–25)
CALCIUM: 9.4 mg/dL (ref 8.6–10.4)
CHLORIDE: 102 mmol/L (ref 98–110)
CO2: 29 mmol/L (ref 20–32)
Creat: 0.83 mg/dL (ref 0.60–0.88)
Globulin: 2.5 g/dL (calc) (ref 1.9–3.7)
Glucose, Bld: 121 mg/dL (ref 65–139)
Potassium: 4.8 mmol/L (ref 3.5–5.3)
Sodium: 137 mmol/L (ref 135–146)
Total Protein: 6.2 g/dL (ref 6.1–8.1)

## 2017-02-16 LAB — CBC WITH DIFFERENTIAL/PLATELET
Basophils Absolute: 41 cells/uL (ref 0–200)
Basophils Relative: 0.6 %
EOS ABS: 110 {cells}/uL (ref 15–500)
Eosinophils Relative: 1.6 %
HCT: 35.4 % (ref 35.0–45.0)
Hemoglobin: 12.2 g/dL (ref 11.7–15.5)
Lymphs Abs: 2118 cells/uL (ref 850–3900)
MCH: 31 pg (ref 27.0–33.0)
MCHC: 34.5 g/dL (ref 32.0–36.0)
MCV: 90.1 fL (ref 80.0–100.0)
MPV: 9.7 fL (ref 7.5–12.5)
Monocytes Relative: 8.6 %
Neutro Abs: 4037 cells/uL (ref 1500–7800)
Neutrophils Relative %: 58.5 %
PLATELETS: 273 10*3/uL (ref 140–400)
RBC: 3.93 10*6/uL (ref 3.80–5.10)
RDW: 13.6 % (ref 11.0–15.0)
TOTAL LYMPHOCYTE: 30.7 %
WBC: 6.9 10*3/uL (ref 3.8–10.8)
WBCMIX: 593 {cells}/uL (ref 200–950)

## 2017-02-16 LAB — CLOSTRIDIUM DIFFICILE BY PCR: Toxigenic C. Difficile by PCR: NOT DETECTED

## 2017-02-16 LAB — LIPASE: LIPASE: 24 U/L (ref 7–60)

## 2017-03-18 DIAGNOSIS — Z713 Dietary counseling and surveillance: Secondary | ICD-10-CM | POA: Diagnosis not present

## 2017-03-18 DIAGNOSIS — R911 Solitary pulmonary nodule: Secondary | ICD-10-CM | POA: Diagnosis not present

## 2017-03-18 DIAGNOSIS — Z6824 Body mass index (BMI) 24.0-24.9, adult: Secondary | ICD-10-CM | POA: Diagnosis not present

## 2017-03-18 DIAGNOSIS — K5790 Diverticulosis of intestine, part unspecified, without perforation or abscess without bleeding: Secondary | ICD-10-CM | POA: Diagnosis not present

## 2017-03-18 DIAGNOSIS — E785 Hyperlipidemia, unspecified: Secondary | ICD-10-CM | POA: Diagnosis not present

## 2017-03-18 DIAGNOSIS — Z299 Encounter for prophylactic measures, unspecified: Secondary | ICD-10-CM | POA: Diagnosis not present

## 2017-03-18 DIAGNOSIS — I10 Essential (primary) hypertension: Secondary | ICD-10-CM | POA: Diagnosis not present

## 2017-03-18 DIAGNOSIS — M810 Age-related osteoporosis without current pathological fracture: Secondary | ICD-10-CM | POA: Diagnosis not present

## 2017-03-18 DIAGNOSIS — R9389 Abnormal findings on diagnostic imaging of other specified body structures: Secondary | ICD-10-CM | POA: Diagnosis not present

## 2017-03-23 ENCOUNTER — Other Ambulatory Visit (HOSPITAL_COMMUNITY): Payer: Self-pay | Admitting: Internal Medicine

## 2017-03-23 DIAGNOSIS — R911 Solitary pulmonary nodule: Secondary | ICD-10-CM

## 2017-03-27 ENCOUNTER — Ambulatory Visit (HOSPITAL_COMMUNITY)
Admission: RE | Admit: 2017-03-27 | Discharge: 2017-03-27 | Disposition: A | Discharge status: Home / Self Care | Payer: PPO | Source: Ambulatory Visit | Attending: Internal Medicine | Admitting: Internal Medicine

## 2017-03-27 DIAGNOSIS — R918 Other nonspecific abnormal finding of lung field: Secondary | ICD-10-CM | POA: Diagnosis not present

## 2017-03-27 DIAGNOSIS — I700 Atherosclerosis of aorta: Secondary | ICD-10-CM | POA: Diagnosis not present

## 2017-03-27 DIAGNOSIS — R911 Solitary pulmonary nodule: Secondary | ICD-10-CM | POA: Diagnosis not present

## 2017-03-30 ENCOUNTER — Encounter: Payer: Self-pay | Admitting: Nurse Practitioner

## 2017-03-30 ENCOUNTER — Ambulatory Visit: Payer: PPO | Admitting: Nurse Practitioner

## 2017-03-30 VITALS — BP 129/70 | HR 66 | Temp 97.3°F | Ht 64.0 in | Wt 139.4 lb

## 2017-03-30 DIAGNOSIS — R634 Abnormal weight loss: Secondary | ICD-10-CM | POA: Diagnosis not present

## 2017-03-30 DIAGNOSIS — K2100 Gastro-esophageal reflux disease with esophagitis, without bleeding: Secondary | ICD-10-CM

## 2017-03-30 DIAGNOSIS — R197 Diarrhea, unspecified: Secondary | ICD-10-CM | POA: Diagnosis not present

## 2017-03-30 DIAGNOSIS — R110 Nausea: Secondary | ICD-10-CM

## 2017-03-30 NOTE — Assessment & Plan Note (Signed)
Symptoms doing well on PPI.  Recommend she continue her PPI.  Intermittently worsening reflux could be a component of her nausea.  Return for follow-up in 6 months.  Call if any questions before then.

## 2017-03-30 NOTE — Assessment & Plan Note (Signed)
Weight has been stable since her last visit.  She is actually put on 1 pound.  She states her appetite has returned and she is eating better now.  Recommend she continue a healthy diet, return for follow-up in 6 months.  Call if any worsening problems before then.

## 2017-03-30 NOTE — Patient Instructions (Signed)
1. Continue taking your current medications, both prescription and over-the-counter. 2. If the gingerroot helps, continue to take it. 3. Follow-up with Dr. Woody Seller related to the CAT scan of your lungs and the ultrasound of your pelvis. 4. Return for follow-up in 6 months. 5. Call us if you have any worsening symptoms before then, questions, or concerns.

## 2017-03-30 NOTE — Progress Notes (Signed)
Referring Provider: Glenda Chroman, MD Primary Care Physician:  Glenda Chroman, MD Primary GI:  Dr. Gala Romney  Chief Complaint  Patient presents with  . Nausea    improved. No vomiting    HPI:   Debra Spencer is a 83 y.o. female who presents for follow-up on nausea.  The patient was last seen in our office 02/11/2017 for weight loss, nausea and vomiting, diarrhea.  Her last visit she was having persistent nausea for a number of years which became worse after surgery with pain medications.  Her nausea persisted after stopping her pain medicines.  Decreased appetite, some weight loss of 15-20 pounds in the previous 6 months but has had ongoing stress due to husband's illness with pancreatic cancer and her surgery.  No vomiting.  Diarrhea began the day prior and had had about 5 watery stools in the previous 24 hours.  No other GI symptoms.  Occasional GERD, but on omeprazole which well controls her symptoms.  Recommended stool studies, T of the abdomen and pelvis, Zofran, follow-up in 4-6 weeks.  Labs were reviewed, normal CBC, normal CMP, normal GI pathogen panel, normal lipase.  CT of the abdomen and pelvis was reviewed and found decreased attenuation of the central uterus which was noted to be abnormal for a patient of this age and recommended ultrasound of the pelvis.  Scattered nodular densities in the lung bases and recommended follow-up CT exam.  No GI specific findings.  CT results were communicated to the patient and forwarded to primary care for further evaluation.  Today she states she's doing better. Ginger has helped the nausea. Had a follow-up CT chest with PCP and is supposed to call them for results today. Has chronic GERD and PPI was increased. Rare breakthrough GERD symptoms. Occasional epigastric tenderness which is brief and self-resolves. Also some lower abdominal cramping, bowel movements are generally regular and [place] 4. Denies abdominal pain, fever, chills, continued weight  loss.  Objectively weight is stable/increased 1 lb in the past 6 weeks. Appetite is back to normal now that she has recovered from surgery. Husband passed away from pancreatic CA in June. Has subsequently been under a lot of stress. Denies chest pain, dyspnea, dizziness, lightheadedness, syncope, near syncope. Denies any other upper or lower GI symptoms.  NOTE: Patient PMH and [place] incomplete in this note due to computer issues. See history tab for complete information. History tab information was reviewed with the patient and found to be correct.  Past Medical History:  Diagnosis Date  . Anxiety    hx   . Cerebrovascular disease    nonobstructive. carotid dopplers, July 2008  . Coronary atherosclerosis    mild; treated medically. neg asenosine stress cardiolite, EF 77% 6/08. low risk exercise stress cardiolite 11/06. sugg. of possib. small area  of apical ischemia. minor luminal irreg., 10-20% ostial LAD stenosis at cardiac cath 11/09.   . Diverticulitis   . Drug intolerance    hx of nitrate intolerance   . Dyslipidemia    intolerant to simvastatin  . GERD (gastroesophageal reflux disease)     Past Surgical History:  Procedure Laterality Date  . APPENDECTOMY    . BACK SURGERY  12/2016   L4-L5  . CHOLECYSTECTOMY    . TONSILLECTOMY      Current Outpatient Medications  Medication Sig Dispense Refill  . alendronate (FOSAMAX) 70 MG tablet Take 1 tablet by mouth once a week. Take 1 tab once a week    .  ASPIRIN 81 PO Take 81 mg by mouth daily.    . Calcium-Vitamin D 600-200 MG-UNIT per tablet Take 1 tablet by mouth daily. 600-200?     Marland Kitchen Ginger, Zingiber officinalis, (GINGER ROOT PO) Take daily by mouth.    . metoprolol tartrate (LOPRESSOR) 50 MG tablet Take 1 tablet (50 mg total) by mouth 2 (two) times daily. 60 tablet 11  . Multiple Vitamin (MULTIVITAMIN) tablet Take 1 tablet by mouth daily.      . nitroGLYCERIN (NITROSTAT) 0.4 MG SL tablet Place 1 tablet (0.4 mg total) under the  tongue every 5 (five) minutes as needed for chest pain. 25 tablet 3  . Omega-3 Fatty Acids (FISH OIL PO) Take by mouth.    Marland Kitchen omeprazole (PRILOSEC) 20 MG capsule Take 1 capsule (20 mg total) by mouth 2 (two) times daily. 60 capsule 5  . ondansetron (ZOFRAN) 4 MG tablet Take 1 tablet (4 mg total) by mouth every 8 (eight) hours as needed for nausea or vomiting. 30 tablet 1   No current facility-administered medications for this visit.     Allergies as of 03/30/2017 - Review Complete 03/30/2017  Allergen Reaction Noted  . Amoxicillin Nausea And Vomiting   . Erythromycin Nausea And Vomiting   . Nitrofurantoin Nausea And Vomiting     Family History  Problem Relation Age of Onset  . Colon cancer Neg Hx     Social History   Socioeconomic History  . Marital status: Widowed    Spouse name: None  . Number of children: None  . Years of education: None  . Highest education level: None  Social Needs  . Financial resource strain: None  . Food insecurity - worry: None  . Food insecurity - inability: None  . Transportation needs - medical: None  . Transportation needs - non-medical: None  Occupational History  . None  Tobacco Use  . Smoking status: Never Smoker  . Smokeless tobacco: Never Used  . Tobacco comment: tobacco use - no   Substance and Sexual Activity  . Alcohol use: No    Alcohol/week: 0.0 oz  . Drug use: No  . Sexual activity: None  Other Topics Concern  . None  Social History Narrative   Married, retired.     Review of Systems: General: Negative for anorexia, weight loss, fever, chills, fatigue, weakness. ENT: Negative for hoarseness, difficulty swallowing. CV: Negative for chest pain, angina, palpitations, peripheral edema.  Respiratory: Negative for dyspnea at rest, cough, sputum, wheezing.  GI: See history of present illness. Endo: Negative for unusual weight change.  Heme: Negative for bruising or bleeding. Allergy: Negative for rash or hives.   Physical  Exam: BP 129/70   Pulse 66   Temp (!) 97.3 F (36.3 C) (Oral)   Ht 5\' 4"  (1.626 m)   Wt 139 lb 6.4 oz (63.2 kg)   BMI 23.93 kg/m  General:   Alert and oriented. Pleasant and cooperative. Well-nourished and well-developed.  Eyes:  Without icterus, sclera clear and conjunctiva pink.  Ears:  Normal auditory acuity. Cardiovascular:  S1, S2 present without murmurs appreciated. Extremities without clubbing or edema. Respiratory:  Clear to auscultation bilaterally. No wheezes, rales, or rhonchi. No distress.  Gastrointestinal:  +BS, soft, non-tender and non-distended. No HSM noted. No guarding or rebound. No masses appreciated.  Rectal:  Deferred  Musculoskalatal:  Symmetrical without gross deformities. Neurologic:  Alert and oriented x4;  grossly normal neurologically. Psych:  Alert and cooperative. Normal mood and affect. Heme/Lymph/Immune: No excessive  bruising noted.    03/30/2017 12:17 PM   Disclaimer: This note was dictated with voice recognition software. Similar sounding words can inadvertently be transcribed and may not be corrected upon review.

## 2017-03-30 NOTE — Assessment & Plan Note (Signed)
Nausea has improved with the use of ginger root.  Labs have been unremarkable.  CT of the abdomen unremarkable.  There were some nodules found in her chest CT that was ordered as follow-up by her primary care.  These appear to be stable and non-concerning.  Possible follow-up CT pending risk factors.  They also have an appointment scheduled for pelvic ultrasound to follow-up on abnormal appearing uterus on CT of the abdomen and pelvis.  Any of these could have contributory effects on her nausea.  She is also been under significant stress with recent back surgery, her husband's prolonged battle with pancreatic cancer and his subsequent passing 4 months ago.  At this point the main goal is symptomatic control.  Further etiologies pending additional workup.  Improvement in weight loss is reassuring.

## 2017-03-30 NOTE — Assessment & Plan Note (Signed)
Diarrhea has resolved.  Continue to monitor.

## 2017-03-31 NOTE — Progress Notes (Signed)
CC'D TO PCP °

## 2017-04-07 DIAGNOSIS — R935 Abnormal findings on diagnostic imaging of other abdominal regions, including retroperitoneum: Secondary | ICD-10-CM | POA: Diagnosis not present

## 2017-04-15 DIAGNOSIS — H35371 Puckering of macula, right eye: Secondary | ICD-10-CM | POA: Diagnosis not present

## 2017-04-15 DIAGNOSIS — H01003 Unspecified blepharitis right eye, unspecified eyelid: Secondary | ICD-10-CM | POA: Diagnosis not present

## 2017-04-15 DIAGNOSIS — Z961 Presence of intraocular lens: Secondary | ICD-10-CM | POA: Diagnosis not present

## 2017-04-15 DIAGNOSIS — H401231 Low-tension glaucoma, bilateral, mild stage: Secondary | ICD-10-CM | POA: Diagnosis not present

## 2017-04-22 DIAGNOSIS — N939 Abnormal uterine and vaginal bleeding, unspecified: Secondary | ICD-10-CM | POA: Diagnosis not present

## 2017-04-22 DIAGNOSIS — R1084 Generalized abdominal pain: Secondary | ICD-10-CM | POA: Diagnosis not present

## 2017-04-22 DIAGNOSIS — R935 Abnormal findings on diagnostic imaging of other abdominal regions, including retroperitoneum: Secondary | ICD-10-CM | POA: Diagnosis not present

## 2017-04-23 DIAGNOSIS — M1711 Unilateral primary osteoarthritis, right knee: Secondary | ICD-10-CM | POA: Diagnosis not present

## 2017-04-23 DIAGNOSIS — M545 Low back pain: Secondary | ICD-10-CM | POA: Diagnosis not present

## 2017-04-23 DIAGNOSIS — M1612 Unilateral primary osteoarthritis, left hip: Secondary | ICD-10-CM | POA: Diagnosis not present

## 2017-04-23 DIAGNOSIS — Z981 Arthrodesis status: Secondary | ICD-10-CM | POA: Diagnosis not present

## 2017-04-23 DIAGNOSIS — M25561 Pain in right knee: Secondary | ICD-10-CM | POA: Diagnosis not present

## 2017-04-23 DIAGNOSIS — M419 Scoliosis, unspecified: Secondary | ICD-10-CM | POA: Diagnosis not present

## 2017-04-23 DIAGNOSIS — M25552 Pain in left hip: Secondary | ICD-10-CM | POA: Diagnosis not present

## 2017-05-12 DIAGNOSIS — Z7982 Long term (current) use of aspirin: Secondary | ICD-10-CM | POA: Diagnosis not present

## 2017-05-12 DIAGNOSIS — K219 Gastro-esophageal reflux disease without esophagitis: Secondary | ICD-10-CM | POA: Diagnosis not present

## 2017-05-12 DIAGNOSIS — Z808 Family history of malignant neoplasm of other organs or systems: Secondary | ICD-10-CM | POA: Diagnosis not present

## 2017-05-12 DIAGNOSIS — K222 Esophageal obstruction: Secondary | ICD-10-CM | POA: Diagnosis not present

## 2017-05-12 DIAGNOSIS — Z888 Allergy status to other drugs, medicaments and biological substances status: Secondary | ICD-10-CM | POA: Diagnosis not present

## 2017-05-12 DIAGNOSIS — R197 Diarrhea, unspecified: Secondary | ICD-10-CM | POA: Diagnosis not present

## 2017-05-12 DIAGNOSIS — R110 Nausea: Secondary | ICD-10-CM | POA: Diagnosis not present

## 2017-05-12 DIAGNOSIS — Z800 Family history of malignant neoplasm of digestive organs: Secondary | ICD-10-CM | POA: Diagnosis not present

## 2017-05-12 DIAGNOSIS — Z9049 Acquired absence of other specified parts of digestive tract: Secondary | ICD-10-CM | POA: Diagnosis not present

## 2017-05-12 DIAGNOSIS — Z79899 Other long term (current) drug therapy: Secondary | ICD-10-CM | POA: Diagnosis not present

## 2017-05-12 DIAGNOSIS — I10 Essential (primary) hypertension: Secondary | ICD-10-CM | POA: Diagnosis not present

## 2017-05-12 DIAGNOSIS — Z881 Allergy status to other antibiotic agents status: Secondary | ICD-10-CM | POA: Diagnosis not present

## 2017-05-12 DIAGNOSIS — I2510 Atherosclerotic heart disease of native coronary artery without angina pectoris: Secondary | ICD-10-CM | POA: Diagnosis not present

## 2017-05-12 DIAGNOSIS — R9389 Abnormal findings on diagnostic imaging of other specified body structures: Secondary | ICD-10-CM | POA: Diagnosis not present

## 2017-05-12 DIAGNOSIS — N840 Polyp of corpus uteri: Secondary | ICD-10-CM | POA: Diagnosis not present

## 2017-05-13 DIAGNOSIS — N858 Other specified noninflammatory disorders of uterus: Secondary | ICD-10-CM | POA: Diagnosis not present

## 2017-05-13 DIAGNOSIS — N840 Polyp of corpus uteri: Secondary | ICD-10-CM | POA: Diagnosis not present

## 2017-05-27 DIAGNOSIS — Z09 Encounter for follow-up examination after completed treatment for conditions other than malignant neoplasm: Secondary | ICD-10-CM | POA: Diagnosis not present

## 2017-06-16 DIAGNOSIS — M47817 Spondylosis without myelopathy or radiculopathy, lumbosacral region: Secondary | ICD-10-CM | POA: Diagnosis not present

## 2017-06-16 DIAGNOSIS — M545 Low back pain: Secondary | ICD-10-CM | POA: Diagnosis not present

## 2017-06-19 DIAGNOSIS — G4700 Insomnia, unspecified: Secondary | ICD-10-CM | POA: Diagnosis not present

## 2017-06-19 DIAGNOSIS — I10 Essential (primary) hypertension: Secondary | ICD-10-CM | POA: Diagnosis not present

## 2017-06-19 DIAGNOSIS — M810 Age-related osteoporosis without current pathological fracture: Secondary | ICD-10-CM | POA: Diagnosis not present

## 2017-06-19 DIAGNOSIS — M7731 Calcaneal spur, right foot: Secondary | ICD-10-CM | POA: Diagnosis not present

## 2017-06-19 DIAGNOSIS — K219 Gastro-esophageal reflux disease without esophagitis: Secondary | ICD-10-CM | POA: Diagnosis not present

## 2017-06-19 DIAGNOSIS — Z6825 Body mass index (BMI) 25.0-25.9, adult: Secondary | ICD-10-CM | POA: Diagnosis not present

## 2017-06-19 DIAGNOSIS — H409 Unspecified glaucoma: Secondary | ICD-10-CM | POA: Diagnosis not present

## 2017-06-19 DIAGNOSIS — S8264XA Nondisplaced fracture of lateral malleolus of right fibula, initial encounter for closed fracture: Secondary | ICD-10-CM | POA: Diagnosis not present

## 2017-06-19 DIAGNOSIS — M25571 Pain in right ankle and joints of right foot: Secondary | ICD-10-CM | POA: Diagnosis not present

## 2017-06-19 DIAGNOSIS — S82831A Other fracture of upper and lower end of right fibula, initial encounter for closed fracture: Secondary | ICD-10-CM | POA: Diagnosis not present

## 2017-06-19 DIAGNOSIS — W1839XA Other fall on same level, initial encounter: Secondary | ICD-10-CM | POA: Diagnosis not present

## 2017-06-19 DIAGNOSIS — M25551 Pain in right hip: Secondary | ICD-10-CM | POA: Diagnosis not present

## 2017-06-19 DIAGNOSIS — Z7982 Long term (current) use of aspirin: Secondary | ICD-10-CM | POA: Diagnosis not present

## 2017-06-19 DIAGNOSIS — I2510 Atherosclerotic heart disease of native coronary artery without angina pectoris: Secondary | ICD-10-CM | POA: Diagnosis not present

## 2017-06-19 DIAGNOSIS — Z79899 Other long term (current) drug therapy: Secondary | ICD-10-CM | POA: Diagnosis not present

## 2017-06-19 DIAGNOSIS — Z299 Encounter for prophylactic measures, unspecified: Secondary | ICD-10-CM | POA: Diagnosis not present

## 2017-06-19 DIAGNOSIS — S89301A Unspecified physeal fracture of lower end of right fibula, initial encounter for closed fracture: Secondary | ICD-10-CM | POA: Diagnosis not present

## 2017-06-22 DIAGNOSIS — S82401A Unspecified fracture of shaft of right fibula, initial encounter for closed fracture: Secondary | ICD-10-CM | POA: Diagnosis not present

## 2017-06-22 DIAGNOSIS — I10 Essential (primary) hypertension: Secondary | ICD-10-CM | POA: Diagnosis not present

## 2017-06-22 DIAGNOSIS — Z6825 Body mass index (BMI) 25.0-25.9, adult: Secondary | ICD-10-CM | POA: Diagnosis not present

## 2017-06-22 DIAGNOSIS — Z299 Encounter for prophylactic measures, unspecified: Secondary | ICD-10-CM | POA: Diagnosis not present

## 2017-06-22 DIAGNOSIS — Z713 Dietary counseling and surveillance: Secondary | ICD-10-CM | POA: Diagnosis not present

## 2017-06-24 ENCOUNTER — Ambulatory Visit (INDEPENDENT_AMBULATORY_CARE_PROVIDER_SITE_OTHER): Payer: PPO | Admitting: Orthopaedic Surgery

## 2017-06-24 ENCOUNTER — Encounter (INDEPENDENT_AMBULATORY_CARE_PROVIDER_SITE_OTHER): Payer: Self-pay | Admitting: Orthopaedic Surgery

## 2017-06-24 VITALS — BP 136/72 | HR 76

## 2017-06-24 DIAGNOSIS — S8264XA Nondisplaced fracture of lateral malleolus of right fibula, initial encounter for closed fracture: Secondary | ICD-10-CM

## 2017-06-24 NOTE — Progress Notes (Signed)
Office Visit Note   Patient: Debra Spencer           Date of Birth: [birth year]           MRN: [id number] Visit Date: 06/24/2017              Requested by: Glenda Chroman, MD [place], [place] [id number] PCP: Glenda Chroman, MD   Assessment & Plan: Visit Diagnoses:  1. Closed nondisplaced fracture of lateral malleolus of right fibula, initial encounter     Plan: [name] is 84 years old and accompanied by her daughter. She injured her right ankle this past Friday and was seen in the emergency room at [facility] revealed a nondisplaced fracture of the distal right fibula. She was placed and a short leg splint and nonweightbearing. She now is here for follow-up evaluation. She is comfortable in the splint. I reviewed the films demonstrating a nondisplaced fracture. I will place her in a short leg cast and allow her to bear some weight with her walker. Follow up in 2 weeks to repeat films in the cast  Follow-Up Instructions: Return in about 2 weeks (around 07/08/2017).   Orders:  No orders of the defined types were placed in this encounter.  No orders of the defined types were placed in this encounter.     Procedures: No procedures performed   Clinical Data: No additional findings.   Subjective: Chief Complaint  Patient presents with  . Right Ankle - Fracture    [name] is an 65 y o here today for a nondisplaced FX distal fibula from a fall 06/19/2017.  initially evaluated in the emergency room at [facility] demonstrated a nondisplaced fracture of the lateral malleolus right ankle. I reviewed the films and agree with that diagnosis. She's been in a short leg well-padded splint without any pain. She is independent and lives by herself. Her daughter is been with her since the injury  HPI  Review of Systems   Objective: Vital Signs: BP 136/72   Pulse 76   Physical Exam  Ortho Examawake alert and oriented 3. Comfortable sitting  in the wheelchair. Splint was removed from her right lower extremity. There is diffuse nonpitting mild edema from the distal third of her leg to the dorsum of her foot. Areas of resolving ecchymosis. Skin intact. Good capillary refill to toes. Normal sensibility. She has  tendernessover the distal the fibulaand to some extent over the medial malleolus. No obvious deformity  Specialty Comments:  No specialty comments available.  Imaging: No results found.   PMFS History: Patient Active Problem List   Diagnosis Date Noted  . Nausea without vomiting 02/11/2017  . Diarrhea 02/11/2017  . Essential hypertension 09/15/2016  . Gastroesophageal reflux disease with esophagitis 09/15/2016  . Chest pain at rest 04/11/2015  . SCHATZKI'S RING 01/02/2009  . WEIGHT LOSS 01/02/2009  . ABDOMINAL PAIN 01/02/2009  . EPIGASTRIC PAIN 01/02/2009  . HYPERLIPIDEMIA 04/18/2008  . CORONARY ATHEROSCLEROSIS NATIVE CORONARY ARTERY 04/18/2008  . CHEST PAIN UNSPECIFIED 04/18/2008   Past Medical History:  Diagnosis Date  . Anxiety    hx   . Cerebrovascular disease    nonobstructive. carotid dopplers, July 2008  . Coronary atherosclerosis    mild; treated medically. neg asenosine stress cardiolite, EF 77% 6/08. low risk exercise stress cardiolite 11/06. sugg. of possib. small area  of apical ischemia. minor luminal irreg., 10-20% ostial LAD stenosis at cardiac cath 11/09.   Marland Kitchen  Diverticulitis   . Drug intolerance    hx of nitrate intolerance   . Dyslipidemia    intolerant to simvastatin  . GERD (gastroesophageal reflux disease)     Family History  Problem Relation Age of Onset  . Colon cancer Neg Hx     Past Surgical History:  Procedure Laterality Date  . APPENDECTOMY    . BACK SURGERY  12/2016   L4-L5  . CHOLECYSTECTOMY    . TONSILLECTOMY     Social History   Occupational History  . Not on file  Tobacco Use  . Smoking status: Never Smoker  . Smokeless tobacco: Never Used  . Tobacco comment:  tobacco use - no   Substance and Sexual Activity  . Alcohol use: No    Alcohol/week: 0.0 oz  . Drug use: No  . Sexual activity: Not on file     Garald Balding, MD   Note - This record has been created using [employer].  Chart creation errors have been sought, but may not always  have been located. Such creation errors do not reflect on  the standard of medical care.

## 2017-06-25 ENCOUNTER — Ambulatory Visit (INDEPENDENT_AMBULATORY_CARE_PROVIDER_SITE_OTHER): Payer: Self-pay | Admitting: Orthopaedic Surgery

## 2017-07-08 ENCOUNTER — Ambulatory Visit (INDEPENDENT_AMBULATORY_CARE_PROVIDER_SITE_OTHER): Payer: PPO | Admitting: Orthopaedic Surgery

## 2017-07-08 ENCOUNTER — Ambulatory Visit (INDEPENDENT_AMBULATORY_CARE_PROVIDER_SITE_OTHER): Payer: PPO

## 2017-07-08 DIAGNOSIS — M25571 Pain in right ankle and joints of right foot: Secondary | ICD-10-CM

## 2017-07-08 NOTE — Progress Notes (Signed)
Office Visit Note   Patient: Debra Spencer           Date of Birth: 1927-07-28           MRN: [id number] Visit Date: 07/08/2017              Requested by: Glenda Chroman, MD [place], [place] [id number] PCP: Glenda Chroman, MD   Assessment & Plan: Visit Diagnoses:  1. Pain in right ankle and joints of right foot     Plan: Nearly 3 weeks status post right ankle injury sustaining a nondisplaced distal fibula fracture. Presently in a short leg nonweightbearing cast and comfortable. Repeat films today reveal anatomic position. We'll allow weightbearing as tolerated in the cast with a walker. Office 2 weeks. Remove the cast and repeat the films  Follow-Up Instructions: Return in about 2 weeks (around 07/22/2017).   Orders:  Orders Placed This Encounter  Procedures  . XR Ankle Complete Right   No orders of the defined types were placed in this encounter.     Procedures: No procedures performed   Clinical Data: No additional findings.   Subjective: Chief Complaint  Patient presents with  . Right Leg - Fracture    [name] is an 84 y o  Here for a nondisplaced fracture of the distal right fibula.   Doing well without complaints  HPI  Review of Systems   Objective: Vital Signs: There were no vitals taken for this visit.  Physical Exam  Ortho Exam awake and alert. Evaluated in a wheelchair. Short leg cast right lower extremity intact. No distal edema of the toes. Good capillary refill. No related pain  No specialty comments available.  Imaging: Xr Ankle Complete Right  Result Date: 07/08/2017 Films of the right ankle obtained in 3 projections in the short leg cast. Anatomic position of the distal fibula fracture. Ankle mortise is intact    PMFS History: Patient Active Problem List   Diagnosis Date Noted  . Nausea without vomiting 02/11/2017  . Diarrhea 02/11/2017  . Essential hypertension 09/15/2016  . Gastroesophageal reflux disease with  esophagitis 09/15/2016  . Chest pain at rest 04/11/2015  . SCHATZKI'S RING 01/02/2009  . WEIGHT LOSS 01/02/2009  . ABDOMINAL PAIN 01/02/2009  . EPIGASTRIC PAIN 01/02/2009  . HYPERLIPIDEMIA 04/18/2008  . CORONARY ATHEROSCLEROSIS NATIVE CORONARY ARTERY 04/18/2008  . CHEST PAIN UNSPECIFIED 04/18/2008   Past Medical History:  Diagnosis Date  . Anxiety    hx   . Cerebrovascular disease    nonobstructive. carotid dopplers, July 2008  . Coronary atherosclerosis    mild; treated medically. neg asenosine stress cardiolite, EF 77% 6/08. low risk exercise stress cardiolite 11/06. sugg. of possib. small area  of apical ischemia. minor luminal irreg., 10-20% ostial LAD stenosis at cardiac cath 11/09.   . Diverticulitis   . Drug intolerance    hx of nitrate intolerance   . Dyslipidemia    intolerant to simvastatin  . GERD (gastroesophageal reflux disease)     Family History  Problem Relation Age of Onset  . Colon cancer Neg Hx     Past Surgical History:  Procedure Laterality Date  . APPENDECTOMY    . BACK SURGERY  12/2016   L4-L5  . CHOLECYSTECTOMY    . TONSILLECTOMY     Social History   Occupational History  . Not on file  Tobacco Use  . Smoking status: Never Smoker  . Smokeless tobacco: Never Used  . Tobacco comment:  tobacco use - no   Substance and Sexual Activity  . Alcohol use: No    Alcohol/week: 0.0 oz  . Drug use: No  . Sexual activity: Not on file     Garald Balding, MD   Note - This record has been created using [employer].  Chart creation errors have been sought, but may not always  have been located. Such creation errors do not reflect on  the standard of medical care.

## 2017-07-22 ENCOUNTER — Ambulatory Visit (INDEPENDENT_AMBULATORY_CARE_PROVIDER_SITE_OTHER): Payer: PPO

## 2017-07-22 ENCOUNTER — Ambulatory Visit (INDEPENDENT_AMBULATORY_CARE_PROVIDER_SITE_OTHER): Payer: PPO | Admitting: Orthopaedic Surgery

## 2017-07-22 ENCOUNTER — Encounter (INDEPENDENT_AMBULATORY_CARE_PROVIDER_SITE_OTHER): Payer: Self-pay | Admitting: Orthopaedic Surgery

## 2017-07-22 VITALS — BP 127/69 | HR 65 | Ht 64.0 in | Wt 130.0 lb

## 2017-07-22 DIAGNOSIS — M25571 Pain in right ankle and joints of right foot: Secondary | ICD-10-CM

## 2017-07-22 NOTE — Progress Notes (Signed)
Office Visit Note   Patient: Debra Spencer           Date of Birth: 04-Apr-1928           MRN: [id number] Visit Date: 07/22/2017              Requested by: Glenda Chroman, MD [place], [place] [address] PCP: Glenda Chroman, MD   Assessment & Plan: Visit Diagnoses:  1. Pain in right ankle and joints of right foot     Plan: Excellent progress with a closed nondisplaced right distal fibula fracture. X-rays reveal anatomic position. Now 1 month post injury. Increase weightbearing in the equalizer boot and return in 2 weeks. Repeat films at that point. Remove cast today. Long discussion regarding need for the boot with weightbearing and use of the rolling walker.  Follow-Up Instructions: Return in about 2 weeks (around 08/05/2017).   Orders:  Orders Placed This Encounter  Procedures  . XR Ankle 2 Views Right   No orders of the defined types were placed in this encounter.     Procedures: No procedures performed   Clinical Data: No additional findings.   Subjective: Chief Complaint  Patient presents with  . Right Ankle - Follow-up    4 weeks status post right ankle injury sustaining a nondisplaced distal fibula fracture. Presently in a short leg nonweightbearing cast and comfortable    HPI  Review of Systems  Constitutional: Negative for chills, fatigue and fever.  HENT: Negative for hearing loss and tinnitus.   Eyes: Negative for itching.  Respiratory: Negative for chest tightness and shortness of breath.   Cardiovascular: Negative.  Negative for chest pain, palpitations and leg swelling.  Gastrointestinal: Negative.  Negative for blood in stool, constipation and diarrhea.  Endocrine: Negative for polyuria.  Genitourinary: Negative for dysuria.  Musculoskeletal: Negative for back pain, joint swelling, neck pain and neck stiffness.  Allergic/Immunologic: Negative for immunocompromised state.  Neurological: Positive for weakness. Negative for dizziness, numbness  and headaches.  Hematological: Does not bruise/bleed easily.  Psychiatric/Behavioral: Negative for sleep disturbance. The patient is not nervous/anxious.      Objective: Vital Signs: BP 127/69   Pulse 65   Ht 5\' 4"  (1.626 m)   Wt 130 lb (59 kg)   BMI 22.31 kg/m   Physical Exam  Ortho Exam cast removed skin intact. Very minimal discomfort along the lateral malleolus. Good sensibility and capillary refill to toes.  Specialty Comments:  No specialty comments available.  Imaging: Xr Ankle 2 Views Right  Result Date: 07/22/2017 Films of the right ankle reveal anatomic position of the lateral malleolus fracture. Ankle mortise intact    PMFS History: Patient Active Problem List   Diagnosis Date Noted  . Nausea without vomiting 02/11/2017  . Diarrhea 02/11/2017  . Essential hypertension 09/15/2016  . Gastroesophageal reflux disease with esophagitis 09/15/2016  . Chest pain at rest 04/11/2015  . SCHATZKI'S RING 01/02/2009  . WEIGHT LOSS 01/02/2009  . ABDOMINAL PAIN 01/02/2009  . EPIGASTRIC PAIN 01/02/2009  . HYPERLIPIDEMIA 04/18/2008  . CORONARY ATHEROSCLEROSIS NATIVE CORONARY ARTERY 04/18/2008  . CHEST PAIN UNSPECIFIED 04/18/2008   Past Medical History:  Diagnosis Date  . Anxiety    hx   . Cerebrovascular disease    nonobstructive. carotid dopplers, July 2008  . Coronary atherosclerosis    mild; treated medically. neg asenosine stress cardiolite, EF 77% 6/08. low risk exercise stress cardiolite 11/06. sugg. of possib. small area  of apical ischemia. minor luminal  irreg., 10-20% ostial LAD stenosis at cardiac cath 11/09.   . Diverticulitis   . Drug intolerance    hx of nitrate intolerance   . Dyslipidemia    intolerant to simvastatin  . GERD (gastroesophageal reflux disease)     Family History  Problem Relation Age of Onset  . Colon cancer Neg Hx     Past Surgical History:  Procedure Laterality Date  . APPENDECTOMY    . BACK SURGERY  12/2016   L4-L5  .  CHOLECYSTECTOMY    . TONSILLECTOMY     Social History   Occupational History  . Not on file  Tobacco Use  . Smoking status: Never Smoker  . Smokeless tobacco: Never Used  . Tobacco comment: tobacco use - no   Substance and Sexual Activity  . Alcohol use: No    Alcohol/week: 0.0 oz  . Drug use: No  . Sexual activity: Not on file

## 2017-07-31 DIAGNOSIS — I10 Essential (primary) hypertension: Secondary | ICD-10-CM | POA: Diagnosis not present

## 2017-07-31 DIAGNOSIS — R079 Chest pain, unspecified: Secondary | ICD-10-CM | POA: Diagnosis not present

## 2017-07-31 DIAGNOSIS — M1990 Unspecified osteoarthritis, unspecified site: Secondary | ICD-10-CM | POA: Diagnosis not present

## 2017-07-31 DIAGNOSIS — Z7982 Long term (current) use of aspirin: Secondary | ICD-10-CM | POA: Diagnosis not present

## 2017-07-31 DIAGNOSIS — Z79899 Other long term (current) drug therapy: Secondary | ICD-10-CM | POA: Diagnosis not present

## 2017-07-31 DIAGNOSIS — I2510 Atherosclerotic heart disease of native coronary artery without angina pectoris: Secondary | ICD-10-CM | POA: Diagnosis not present

## 2017-07-31 DIAGNOSIS — M810 Age-related osteoporosis without current pathological fracture: Secondary | ICD-10-CM | POA: Diagnosis not present

## 2017-07-31 DIAGNOSIS — M940 Chondrocostal junction syndrome [Tietze]: Secondary | ICD-10-CM | POA: Diagnosis not present

## 2017-07-31 DIAGNOSIS — R0789 Other chest pain: Secondary | ICD-10-CM | POA: Diagnosis not present

## 2017-07-31 DIAGNOSIS — R071 Chest pain on breathing: Secondary | ICD-10-CM | POA: Diagnosis not present

## 2017-07-31 DIAGNOSIS — R0602 Shortness of breath: Secondary | ICD-10-CM | POA: Diagnosis not present

## 2017-07-31 DIAGNOSIS — K219 Gastro-esophageal reflux disease without esophagitis: Secondary | ICD-10-CM | POA: Diagnosis not present

## 2017-08-05 ENCOUNTER — Ambulatory Visit (INDEPENDENT_AMBULATORY_CARE_PROVIDER_SITE_OTHER): Payer: PPO

## 2017-08-05 ENCOUNTER — Ambulatory Visit (INDEPENDENT_AMBULATORY_CARE_PROVIDER_SITE_OTHER): Payer: PPO | Admitting: Orthopaedic Surgery

## 2017-08-05 DIAGNOSIS — M25571 Pain in right ankle and joints of right foot: Secondary | ICD-10-CM

## 2017-08-05 NOTE — Progress Notes (Signed)
Office Visit Note   Patient: Debra Spencer           Date of Birth: 1927-07-26           MRN: [id number] Visit Date: 08/05/2017              Requested by: Glenda Chroman, MD [place], [place] [address] PCP: Glenda Chroman, MD   Assessment & Plan: Visit Diagnoses:  1. Pain in right ankle and joints of right foot     Plan: 6 weeks status post injury to right ankle with films demonstrating a nondisplaced distal fibula fracture.  Has been ambulating in an equalizer boot without problem.  Has an issue with balance and uses a rolling walker. presently not having any pain. Will discontinue the equalizer boot.  Begin outpatient physical therapy.  Return to the office 1 month if still having pain.  Ambulate with weightbearing as tolerated without the boot Follow-Up Instructions: Return if symptoms worsen or fail to improve.   Orders:  Orders Placed This Encounter  Procedures  . XR Ankle Complete Right   No orders of the defined types were placed in this encounter.     Procedures: No procedures performed   Clinical Data: No additional findings.   Subjective: No chief complaint on file. Mrs Dingwall is accompanied by her daughter and here for follow-up evaluation of the right ankle fracture.  6 weeks ago she fell at home and sustained a nondisplaced distal fibula fracture.  Initially treated her with a short leg cast for several weeks boot.  Doing well without any related pain.  Uses a rolling walker for "a long time" for balance  HPI  Review of Systems   Objective: Vital Signs: There were no vitals taken for this visit.  Physical Exam  Ortho Exam awake alert and oriented x3.  Comfortable sitting.  No pain with ambulating with a rolling walker and the equalizer boot.  Skin right ankle intact.  Minimal discomfort about the distal fibula.  No swelling about the lateral ankle.  Vascular exam intact.  No deformity.  Today reveal excellent position of the fracture and ankle  mortise  Specialty Comments:  No specialty comments available.  Imaging: Xr Ankle Complete Right  Result Date: 08/05/2017 Films of the right ankle were obtained in 3 projections.  Previously identified distal fibula fracture appears to be in anatomic position.  Ankle mortise intact.  Mild callus formation about the fracture.    PMFS History: Patient Active Problem List   Diagnosis Date Noted  . Nausea without vomiting 02/11/2017  . Diarrhea 02/11/2017  . Essential hypertension 09/15/2016  . Gastroesophageal reflux disease with esophagitis 09/15/2016  . Chest pain at rest 04/11/2015  . SCHATZKI'S RING 01/02/2009  . WEIGHT LOSS 01/02/2009  . ABDOMINAL PAIN 01/02/2009  . EPIGASTRIC PAIN 01/02/2009  . HYPERLIPIDEMIA 04/18/2008  . CORONARY ATHEROSCLEROSIS NATIVE CORONARY ARTERY 04/18/2008  . CHEST PAIN UNSPECIFIED 04/18/2008   Past Medical History:  Diagnosis Date  . Anxiety    hx   . Cerebrovascular disease    nonobstructive. carotid dopplers, July 2008  . Coronary atherosclerosis    mild; treated medically. neg asenosine stress cardiolite, EF 77% 6/08. low risk exercise stress cardiolite 11/06. sugg. of possib. small area  of apical ischemia. minor luminal irreg., 10-20% ostial LAD stenosis at cardiac cath 11/09.   . Diverticulitis   . Drug intolerance    hx of nitrate intolerance   . Dyslipidemia  intolerant to simvastatin  . GERD (gastroesophageal reflux disease)     Family History  Problem Relation Age of Onset  . Colon cancer Neg Hx     Past Surgical History:  Procedure Laterality Date  . APPENDECTOMY    . BACK SURGERY  12/2016   L4-L5  . CHOLECYSTECTOMY    . TONSILLECTOMY     Social History   Occupational History  . Not on file  Tobacco Use  . Smoking status: Never Smoker  . Smokeless tobacco: Never Used  . Tobacco comment: tobacco use - no   Substance and Sexual Activity  . Alcohol use: No    Alcohol/week: 0.0 oz  . Drug use: No  . Sexual  activity: Not on file     Garald Balding, MD   Note - This record has been created using [employer].  Chart creation errors have been sought, but may not always  have been located. Such creation errors do not reflect on  the standard of medical care.

## 2017-08-13 DIAGNOSIS — R262 Difficulty in walking, not elsewhere classified: Secondary | ICD-10-CM | POA: Diagnosis not present

## 2017-08-13 DIAGNOSIS — S82891D Other fracture of right lower leg, subsequent encounter for closed fracture with routine healing: Secondary | ICD-10-CM | POA: Diagnosis not present

## 2017-08-13 DIAGNOSIS — M25571 Pain in right ankle and joints of right foot: Secondary | ICD-10-CM | POA: Diagnosis not present

## 2017-08-17 ENCOUNTER — Encounter: Payer: Self-pay | Admitting: Cardiovascular Disease

## 2017-08-17 ENCOUNTER — Ambulatory Visit: Payer: PPO | Admitting: Cardiovascular Disease

## 2017-08-17 VITALS — BP 138/76 | HR 70 | Ht 64.0 in | Wt 145.2 lb

## 2017-08-17 DIAGNOSIS — I2510 Atherosclerotic heart disease of native coronary artery without angina pectoris: Secondary | ICD-10-CM | POA: Diagnosis not present

## 2017-08-17 DIAGNOSIS — I10 Essential (primary) hypertension: Secondary | ICD-10-CM | POA: Diagnosis not present

## 2017-08-17 NOTE — Patient Instructions (Signed)
Dr Croitoru recommends that you schedule a follow-up appointment in 12 months. You will receive a reminder letter in the mail two months in advance. If you don't receive a letter, please call our office to schedule the follow-up appointment.  If you need a refill on your cardiac medications before your next appointment, please call your pharmacy. 

## 2017-08-17 NOTE — Progress Notes (Signed)
Cardiology Office Note    Date:  08/17/2017   ID:  Debra Spencer, DOB 09-01-1927, MRN [id number]  PCP:  Glenda Chroman, MD  Cardiologist:   Sanda Klein, MD   Chief Complaint  Patient presents with  . Follow-up    ER f/u; costochondritis    History of Present Illness:  Debra Spencer is a 84 y.o. female in with complaints of chest pain at rest.  Unfortunately, since her last appointment her husband has passed away from pancreatic cancer.  She has generally done well from a cardiovascular point of view but did have a visit to the emergency room on March 8 in [place] she was diagnosed with costochondritis.  And had a normal ECG and normal cardiac enzymes.  The symptoms have not recurred since.  It seems that it was a contribution of shoulder arthritis pain as well.  The patient specifically denies any chest pain with exertion, dyspnea at rest or with exertion, orthopnea, paroxysmal nocturnal dyspnea, syncope, palpitations, focal neurological deficits, intermittent claudication, lower extremity edema, unexplained weight gain, cough, hemoptysis or wheezing.  Most recent evaluation for coronary disease was in normal [facility] November 2016. Prior to that she had cardiac catheterization with Dr. Angelena Form in 2009 showing minimal plaque and a 10-20% ostial LAD stenosis. She has hyperlipidemia but was intolerant of simvastatin. There is a mention of a history of "nitrate intolerance". She does not recall taking either one of these medicines.  Pertinent to note that she has gastroesophageal reflux disease with a history of erosive esophagitis, Schatzki ring, a small hiatal hernia and esophageal dilatation in 2003. She takes low-dose proton pump inhibitor therapy chronically, but is also taking a bisphosphonate weekly.   Past Medical History:  Diagnosis Date  . Anxiety    hx   . Cerebrovascular disease    nonobstructive. carotid dopplers, July 2008  . Coronary atherosclerosis    mild; treated medically. neg asenosine stress cardiolite, EF 77% 6/08. low risk exercise stress cardiolite 11/06. sugg. of possib. small area  of apical ischemia. minor luminal irreg., 10-20% ostial LAD stenosis at cardiac cath 11/09.   . Diverticulitis   . Drug intolerance    hx of nitrate intolerance   . Dyslipidemia    intolerant to simvastatin  . GERD (gastroesophageal reflux disease)     Past Surgical History:  Procedure Laterality Date  . APPENDECTOMY    . BACK SURGERY  12/2016   L4-L5  . CHOLECYSTECTOMY    . TONSILLECTOMY      Current Medications: Outpatient Medications Prior to Visit  Medication Sig Dispense Refill  . alendronate (FOSAMAX) 70 MG tablet Take 1 tablet by mouth once a week. Take 1 tab once a week    . ASPIRIN 81 PO Take 81 mg by mouth daily.    . Calcium-Vitamin D 600-200 MG-UNIT per tablet Take 1 tablet by mouth daily. 600-200?     Marland Kitchen Ginger, Zingiber officinalis, (GINGER ROOT PO) Take daily by mouth.    Marland Kitchen HYDROcodone-acetaminophen (NORCO/VICODIN) 5-325 MG tablet Take 1 tablet by mouth daily as needed.    . metoprolol tartrate (LOPRESSOR) 50 MG tablet Take 1 tablet (50 mg total) by mouth 2 (two) times daily. 60 tablet 11  . Multiple Vitamin (MULTIVITAMIN) tablet Take 1 tablet by mouth daily.      . nitroGLYCERIN (NITROSTAT) 0.4 MG SL tablet Place 1 tablet (0.4 mg total) under the tongue every 5 (five) minutes as needed for chest pain. 25 tablet 3  .  Omega-3 Fatty Acids (FISH OIL PO) Take by mouth.    Marland Kitchen omeprazole (PRILOSEC) 20 MG capsule Take 1 capsule (20 mg total) by mouth 2 (two) times daily. 60 capsule 5  . ondansetron (ZOFRAN) 4 MG tablet Take 1 tablet (4 mg total) by mouth every 8 (eight) hours as needed for nausea or vomiting. 30 tablet 1   No facility-administered medications prior to visit.      Allergies:   Amoxicillin; Erythromycin; and Nitrofurantoin   Social History   Socioeconomic History  . Marital status: Widowed    Spouse name: Not on  file  . Number of children: Not on file  . Years of education: Not on file  . Highest education level: Not on file  Occupational History  . Not on file  Social Needs  . Financial resource strain: Not on file  . Food insecurity:    Worry: Not on file    Inability: Not on file  . Transportation needs:    Medical: Not on file    Non-medical: Not on file  Tobacco Use  . Smoking status: Never Smoker  . Smokeless tobacco: Never Used  . Tobacco comment: tobacco use - no   Substance and Sexual Activity  . Alcohol use: No    Alcohol/week: 0.0 oz  . Drug use: No  . Sexual activity: Not on file  Lifestyle  . Physical activity:    Days per week: Not on file    Minutes per session: Not on file  . Stress: Not on file  Relationships  . Social connections:    Talks on phone: Not on file    Gets together: Not on file    Attends religious service: Not on file    Active member of club or organization: Not on file    Attends meetings of clubs or organizations: Not on file    Relationship status: Not on file  Other Topics Concern  . Not on file  Social History Narrative   Married, retired.      Family History:  The patient's Daughter [name] has diabetes and has had a stroke, but this was probably related to paradoxical embolism).  ROS:   Please see the history of present illness.    ROS All other systems reviewed and are negative.   PHYSICAL EXAM:   VS:  BP 138/76   Pulse 70   Ht 5\' 4"  (1.626 m)   Wt 145 lb 3.2 oz (65.9 kg)   BMI 24.92 kg/m     General: Alert, oriented x3, no distress, lean.  Appears comfortable Head: no evidence of trauma, PERRL, EOMI, no exophtalmos or lid lag, no myxedema, no xanthelasma; normal ears, nose and oropharynx Neck: normal jugular venous pulsations and no hepatojugular reflux; brisk carotid pulses without delay and no carotid bruits Chest: clear to auscultation, no signs of consolidation by percussion or palpation, normal fremitus, symmetrical and  full respiratory excursions Cardiovascular: normal position and quality of the apical impulse, regular rhythm, normal first and second heart sounds, no murmurs, rubs or gallops Abdomen: no tenderness or distention, no masses by palpation, no abnormal pulsatility or arterial bruits, normal bowel sounds, no hepatosplenomegaly Extremities: no clubbing, cyanosis or edema; 2+ radial, ulnar and brachial pulses bilaterally; 2+ right femoral, posterior tibial and dorsalis pedis pulses; 2+ left femoral, posterior tibial and dorsalis pedis pulses; no subclavian or femoral bruits Neurological: grossly nonfocal Psych: Normal mood and affect    Wt Readings from Last 3 Encounters:  08/17/17 145  lb 3.2 oz (65.9 kg)  07/22/17 130 lb (59 kg)  03/30/17 139 lb 6.4 oz (63.2 kg)      Studies/Labs Reviewed:   EKG:  EKG is ordered today.  It is a normal tracing, normal sinus rhythm.  As before she has very tiny R waves in leads V1 and V2  ASSESSMENT:    No diagnosis found.   PLAN:  In order of problems listed above:  1. CAD: She has minor coronary atherosclerosis by previous evaluation.  Her recent complaints have been due to costochondritis and in the past had features suggestive of esophageal spasm.  We discussed a few ways that she might be able to distinguish the etiology of her symptoms without having to come to the emergency room.  Prefers not to take statins.. 2. HTN: Her blood pressure is well controlled.  Excessive reduction in blood pressure should be avoided since she has a history of symptomatic orthostatic hypotension.  Continue same medications.    Medication Adjustments/Labs and Tests Ordered: Current medicines are reviewed at length with the patient today.  Concerns regarding medicines are outlined above.  Medication changes, Labs and Tests ordered today are listed in the Patient Instructions below. Patient Instructions  Dr Sallyanne Kuster recommends that you schedule a follow-up appointment in  12 months. You will receive a reminder letter in the mail two months in advance. If you don't receive a letter, please call our office to schedule the follow-up appointment.  If you need a refill on your cardiac medications before your next appointment, please call your pharmacy.    Signed, Sanda Klein, MD  08/17/2017 5:44 PM    [address], [place], [place]  [address] Phone: [phone]; Fax: [phone]

## 2017-08-19 NOTE — Addendum Note (Signed)
Addended by: Diana Eves on: 08/19/2017 03:50 PM   Modules accepted: Orders

## 2017-08-25 DIAGNOSIS — M25572 Pain in left ankle and joints of left foot: Secondary | ICD-10-CM | POA: Diagnosis not present

## 2017-08-25 DIAGNOSIS — R262 Difficulty in walking, not elsewhere classified: Secondary | ICD-10-CM | POA: Diagnosis not present

## 2017-08-25 DIAGNOSIS — M25571 Pain in right ankle and joints of right foot: Secondary | ICD-10-CM | POA: Diagnosis not present

## 2017-08-25 DIAGNOSIS — S82892D Other fracture of left lower leg, subsequent encounter for closed fracture with routine healing: Secondary | ICD-10-CM | POA: Diagnosis not present

## 2017-09-08 DIAGNOSIS — H01003 Unspecified blepharitis right eye, unspecified eyelid: Secondary | ICD-10-CM | POA: Diagnosis not present

## 2017-09-08 DIAGNOSIS — H401231 Low-tension glaucoma, bilateral, mild stage: Secondary | ICD-10-CM | POA: Diagnosis not present

## 2017-09-09 DIAGNOSIS — H35351 Cystoid macular degeneration, right eye: Secondary | ICD-10-CM | POA: Diagnosis not present

## 2017-09-09 DIAGNOSIS — H31009 Unspecified chorioretinal scars, unspecified eye: Secondary | ICD-10-CM | POA: Diagnosis not present

## 2017-09-09 DIAGNOSIS — Z9889 Other specified postprocedural states: Secondary | ICD-10-CM | POA: Diagnosis not present

## 2017-09-09 DIAGNOSIS — Z961 Presence of intraocular lens: Secondary | ICD-10-CM | POA: Diagnosis not present

## 2017-09-23 DIAGNOSIS — M5126 Other intervertebral disc displacement, lumbar region: Secondary | ICD-10-CM | POA: Diagnosis not present

## 2017-09-28 ENCOUNTER — Encounter: Payer: Self-pay | Admitting: Nurse Practitioner

## 2017-09-28 ENCOUNTER — Encounter: Payer: Self-pay | Admitting: *Deleted

## 2017-09-28 ENCOUNTER — Ambulatory Visit (INDEPENDENT_AMBULATORY_CARE_PROVIDER_SITE_OTHER): Payer: PPO | Admitting: Nurse Practitioner

## 2017-09-28 VITALS — BP 140/82 | HR 71 | Temp 96.6°F | Ht 64.0 in | Wt 142.0 lb

## 2017-09-28 DIAGNOSIS — K2100 Gastro-esophageal reflux disease with esophagitis, without bleeding: Secondary | ICD-10-CM

## 2017-09-28 DIAGNOSIS — R110 Nausea: Secondary | ICD-10-CM | POA: Diagnosis not present

## 2017-09-28 DIAGNOSIS — R1310 Dysphagia, unspecified: Secondary | ICD-10-CM

## 2017-09-28 NOTE — Assessment & Plan Note (Signed)
Patient has some occasional odynophagia symptoms which are located lower esophagus/epigastric area.  Query possible stricture.  However, she does note excessive phlegm in the morning and she could be having copious sinus drainage causing the sensation.  At this point we will check a barium pill esophagram for any esophageal abnormalities.  Return for follow-up in 3 months.  Continue GERD medications.

## 2017-09-28 NOTE — Assessment & Plan Note (Signed)
GERD currently well managed with PPI.  Recommend she continue this, follow-up in 3 months.

## 2017-09-28 NOTE — Assessment & Plan Note (Signed)
Nausea doing well on gingerroot extract.  This works well for her and she prefers to take this versus a prescription medication.  Recommend she continue this as it seems to be working well.  Return for follow-up in 3 months.  No red flag/warning signs or symptoms.

## 2017-09-28 NOTE — Progress Notes (Signed)
Referring Provider: Glenda Chroman, MD Primary Care Physician:  Glenda Chroman, MD Primary GI:  Dr. Gala Romney  Chief Complaint  Patient presents with  . Dysphagia    throat feels full; spits up phlegm    HPI:   Debra Spencer is a 84 y.o. female who presents for follow-up on nausea.  Patient was last seen in our office 03/30/2017 for the same.  Previously noted nausea post surgery with decreased appetite and 15 to 20 pound weight loss on the setting of ongoing significant life stress.  CT of the abdomen and pelvis previously completed and no concerning GI symptoms, forwarded the primary care due to other unrelated findings.  At her last visit she was doing better, ginger is helped the nausea.  Follow-up CT chest pending by primary care.  GERD better controlled on PPI increased dose with bread with breakthrough.  Stools [place] 4.  Weight stable in the previous 6 weeks.  Appetite back to normal.  Husband passed away from pancreatic cancer in June and continues to have significant stress related to this.  Recommended continue medications, continue ginger if helpful, follow-up with primary care related to CT scan, follow-up in 6 months.  Today she states she's doing well overall.  Nausea is improved on Gingerroot. She states she has some throat fullness and coughs up a lot of thick phlegm. States she gets "strangled really easily". Denies overt dysphagia. Occasionally when she eats/drinks certain foods, has lower esophageal/epigastric odynophagia; brief. Denies abdominal pain, vomiting, hematochezia, melena, unintentional weight loss, fever, chills. Recenetly in the ER for costochondritis. Denies chest pain, dyspnea, dizziness, lightheadedness, syncope, near syncope. Denies any other upper or lower GI symptoms.  Past Medical History:  Diagnosis Date  . Anxiety    hx   . Cerebrovascular disease    nonobstructive. carotid dopplers, July 2008  . Coronary atherosclerosis    mild; treated medically.  neg asenosine stress cardiolite, EF 77% 6/08. low risk exercise stress cardiolite 11/06. sugg. of possib. small area  of apical ischemia. minor luminal irreg., 10-20% ostial LAD stenosis at cardiac cath 11/09.   . Diverticulitis   . Drug intolerance    hx of nitrate intolerance   . Dyslipidemia    intolerant to simvastatin  . GERD (gastroesophageal reflux disease)     Past Surgical History:  Procedure Laterality Date  . APPENDECTOMY    . BACK SURGERY  12/2016   L4-L5  . CHOLECYSTECTOMY    . TONSILLECTOMY      Current Outpatient Medications  Medication Sig Dispense Refill  . acetaminophen (TYLENOL) 500 MG tablet Take 1,000 mg by mouth every 6 (six) hours as needed.    Marland Kitchen alendronate (FOSAMAX) 70 MG tablet Take 1 tablet by mouth once a week. Take 1 tab once a week    . ASPIRIN 81 PO Take 81 mg by mouth daily.    . Calcium-Vitamin D 600-200 MG-UNIT per tablet Take 1 tablet by mouth daily. 600-200?     Marland Kitchen cholecalciferol (VITAMIN D) 1000 units tablet Take 1,000 Units by mouth daily.    . Ginger, Zingiber officinalis, (GINGER ROOT PO) Take daily by mouth.    Marland Kitchen ibuprofen (ADVIL,MOTRIN) 200 MG tablet Take 400 mg by mouth every 6 (six) hours as needed.    . latanoprost (XALATAN) 0.005 % ophthalmic solution Place 1 drop into both eyes at bedtime.    . metoprolol tartrate (LOPRESSOR) 50 MG tablet Take 1 tablet (50 mg total) by mouth 2 (two)  times daily. 60 tablet 11  . Multiple Vitamin (MULTIVITAMIN) tablet Take 1 tablet by mouth daily.      . nitroGLYCERIN (NITROSTAT) 0.4 MG SL tablet Place 1 tablet (0.4 mg total) under the tongue every 5 (five) minutes as needed for chest pain. 25 tablet 3  . Omega-3 Fatty Acids (FISH OIL PO) Take by mouth 2 (two) times daily.     . pantoprazole (PROTONIX) 40 MG tablet Take 40 mg by mouth daily.     No current facility-administered medications for this visit.     Allergies as of 09/28/2017 - Review Complete 09/28/2017  Allergen Reaction Noted  .  Amoxicillin Nausea And Vomiting   . Erythromycin Nausea And Vomiting   . Nitrofurantoin Nausea And Vomiting   . Other  09/28/2017    Family History  Problem Relation Age of Onset  . Colon cancer Neg Hx     Social History   Socioeconomic History  . Marital status: Widowed    Spouse name: Not on file  . Number of children: Not on file  . Years of education: Not on file  . Highest education level: Not on file  Occupational History  . Not on file  Social Needs  . Financial resource strain: Not on file  . Food insecurity:    Worry: Not on file    Inability: Not on file  . Transportation needs:    Medical: Not on file    Non-medical: Not on file  Tobacco Use  . Smoking status: Never Smoker  . Smokeless tobacco: Never Used  . Tobacco comment: tobacco use - no   Substance and Sexual Activity  . Alcohol use: No    Alcohol/week: 0.0 oz  . Drug use: No  . Sexual activity: Not on file  Lifestyle  . Physical activity:    Days per week: Not on file    Minutes per session: Not on file  . Stress: Not on file  Relationships  . Social connections:    Talks on phone: Not on file    Gets together: Not on file    Attends religious service: Not on file    Active member of club or organization: Not on file    Attends meetings of clubs or organizations: Not on file    Relationship status: Not on file  Other Topics Concern  . Not on file  Social History Narrative   Married, retired.     Review of Systems: General: Negative for anorexia, weight loss, fever, chills, fatigue, weakness. ENT: Negative for hoarseness, difficulty swallowing , nasal congestion. CV: Negative for chest pain, angina, palpitations, dyspnea on exertion, peripheral edema.  Respiratory: Negative for dyspnea at rest, dyspnea on exertion, cough, sputum, wheezing.  GI: See history of present illness. Endo: Negative for unusual weight change.  Heme: Negative for bruising or bleeding. Allergy: Negative for rash  or hives.   Physical Exam: BP 140/82   Pulse 71   Temp (!) 96.6 F (35.9 C) (Oral)   Ht 5\' 4"  (1.626 m)   Wt 142 lb (64.4 kg)   BMI 24.37 kg/m  General:   Alert and oriented. Pleasant and cooperative. Well-nourished and well-developed.  Eyes:  Without icterus, sclera clear and conjunctiva pink.  Ears:  Normal auditory acuity. Cardiovascular:  S1, S2 present without murmurs appreciated. Extremities without clubbing or edema. Respiratory:  Clear to auscultation bilaterally. No wheezes, rales, or rhonchi. No distress.  Gastrointestinal:  +BS, soft, non-tender and non-distended. No HSM noted. No  guarding or rebound. No masses appreciated.  Rectal:  Deferred  Musculoskalatal:  Symmetrical without gross deformities. Skin:  Intact without significant lesions or rashes. Neurologic:  Alert and oriented x4;  grossly normal neurologically. Psych:  Alert and cooperative. Normal mood and affect. Heme/Lymph/Immune: No excessive bruising noted.    09/28/2017 10:24 AM   Disclaimer: This note was dictated with voice recognition software. Similar sounding words can inadvertently be transcribed and may not be corrected upon review.

## 2017-09-28 NOTE — Progress Notes (Signed)
CC'D TO PCP °

## 2017-09-28 NOTE — Addendum Note (Signed)
Addended by: Gordy Levan, ERIC A on: 09/28/2017 10:42 AM   Modules accepted: Orders

## 2017-09-28 NOTE — Patient Instructions (Signed)
1. Continue your current medications. 2. We will schedule your swallowing test for you. 3. We will call you with the results of your swallowing test. 4. Follow-up back in our office in 3 months to look over everything and decide if anything else needs to be done. 5. Call us if you have any questions or concerns.   At [facility] we value your feedback. You may receive a survey about your visit today. Please share your experience as we strive to create trusting relationships with our patients to provide genuine, compassionate, quality care.  It was great to see you today!  I hope you have a wonderful summer, enjoy time in your garden!!

## 2017-10-08 DIAGNOSIS — Z1231 Encounter for screening mammogram for malignant neoplasm of breast: Secondary | ICD-10-CM | POA: Diagnosis not present

## 2017-10-12 ENCOUNTER — Ambulatory Visit (HOSPITAL_COMMUNITY)
Admission: RE | Admit: 2017-10-12 | Discharge: 2017-10-12 | Disposition: A | Discharge status: Home / Self Care | Payer: PPO | Source: Ambulatory Visit | Attending: Nurse Practitioner | Admitting: Nurse Practitioner

## 2017-10-12 DIAGNOSIS — R1310 Dysphagia, unspecified: Secondary | ICD-10-CM | POA: Diagnosis not present

## 2017-10-12 DIAGNOSIS — R110 Nausea: Secondary | ICD-10-CM | POA: Diagnosis not present

## 2017-10-12 DIAGNOSIS — K2100 Gastro-esophageal reflux disease with esophagitis, without bleeding: Secondary | ICD-10-CM

## 2017-10-12 DIAGNOSIS — K219 Gastro-esophageal reflux disease without esophagitis: Secondary | ICD-10-CM | POA: Diagnosis not present

## 2017-11-02 DIAGNOSIS — I10 Essential (primary) hypertension: Secondary | ICD-10-CM | POA: Diagnosis not present

## 2017-11-02 DIAGNOSIS — K209 Esophagitis, unspecified: Secondary | ICD-10-CM | POA: Diagnosis not present

## 2017-11-02 DIAGNOSIS — M940 Chondrocostal junction syndrome [Tietze]: Secondary | ICD-10-CM | POA: Diagnosis not present

## 2017-11-02 DIAGNOSIS — Z299 Encounter for prophylactic measures, unspecified: Secondary | ICD-10-CM | POA: Diagnosis not present

## 2017-11-02 DIAGNOSIS — M1710 Unilateral primary osteoarthritis, unspecified knee: Secondary | ICD-10-CM | POA: Diagnosis not present

## 2017-12-07 DIAGNOSIS — Z6825 Body mass index (BMI) 25.0-25.9, adult: Secondary | ICD-10-CM | POA: Diagnosis not present

## 2017-12-07 DIAGNOSIS — R5383 Other fatigue: Secondary | ICD-10-CM | POA: Diagnosis not present

## 2017-12-07 DIAGNOSIS — Z1339 Encounter for screening examination for other mental health and behavioral disorders: Secondary | ICD-10-CM | POA: Diagnosis not present

## 2017-12-07 DIAGNOSIS — Z299 Encounter for prophylactic measures, unspecified: Secondary | ICD-10-CM | POA: Diagnosis not present

## 2017-12-07 DIAGNOSIS — R0989 Other specified symptoms and signs involving the circulatory and respiratory systems: Secondary | ICD-10-CM | POA: Diagnosis not present

## 2017-12-07 DIAGNOSIS — G4700 Insomnia, unspecified: Secondary | ICD-10-CM | POA: Diagnosis not present

## 2017-12-07 DIAGNOSIS — R918 Other nonspecific abnormal finding of lung field: Secondary | ICD-10-CM | POA: Diagnosis not present

## 2017-12-07 DIAGNOSIS — Z7189 Other specified counseling: Secondary | ICD-10-CM | POA: Diagnosis not present

## 2017-12-07 DIAGNOSIS — Z1331 Encounter for screening for depression: Secondary | ICD-10-CM | POA: Diagnosis not present

## 2017-12-07 DIAGNOSIS — Z0000 Encounter for general adult medical examination without abnormal findings: Secondary | ICD-10-CM | POA: Diagnosis not present

## 2017-12-07 DIAGNOSIS — I10 Essential (primary) hypertension: Secondary | ICD-10-CM | POA: Diagnosis not present

## 2017-12-07 DIAGNOSIS — I700 Atherosclerosis of aorta: Secondary | ICD-10-CM | POA: Diagnosis not present

## 2017-12-10 ENCOUNTER — Other Ambulatory Visit: Payer: Self-pay | Admitting: Cardiovascular Disease

## 2017-12-11 NOTE — Telephone Encounter (Signed)
Rx request sent to pharmacy.  

## 2017-12-14 DIAGNOSIS — Z79899 Other long term (current) drug therapy: Secondary | ICD-10-CM | POA: Diagnosis not present

## 2017-12-14 DIAGNOSIS — R5383 Other fatigue: Secondary | ICD-10-CM | POA: Diagnosis not present

## 2017-12-22 DIAGNOSIS — E2839 Other primary ovarian failure: Secondary | ICD-10-CM | POA: Diagnosis not present

## 2017-12-30 ENCOUNTER — Ambulatory Visit (INDEPENDENT_AMBULATORY_CARE_PROVIDER_SITE_OTHER): Payer: PPO | Admitting: Nurse Practitioner

## 2017-12-30 ENCOUNTER — Encounter: Payer: Self-pay | Admitting: Nurse Practitioner

## 2017-12-30 VITALS — BP 169/78 | HR 80 | Temp 97.0°F | Ht 64.0 in | Wt 143.6 lb

## 2017-12-30 DIAGNOSIS — K2100 Gastro-esophageal reflux disease with esophagitis, without bleeding: Secondary | ICD-10-CM

## 2017-12-30 DIAGNOSIS — R110 Nausea: Secondary | ICD-10-CM | POA: Diagnosis not present

## 2017-12-30 NOTE — Assessment & Plan Note (Signed)
GERD symptoms currently well controlled.  Continue PPI.  Continue other medications.  Follow-up in 6 months and call if any worsening or recurrent symptoms before then.

## 2017-12-30 NOTE — Assessment & Plan Note (Signed)
Nausea continues to be well controlled on gingerroot.  She does not want prescription antinausea medicines at this time.  Follow-up in 6 months, call us if any worsening nausea before then.

## 2017-12-30 NOTE — Progress Notes (Signed)
Referring Provider: Glenda Chroman, MD Primary Care Physician:  Glenda Chroman, MD Primary GI:  Dr. Gala Romney  Chief Complaint  Patient presents with  . Nausea    has improved  . Gastroesophageal Reflux    "thick saliva", some belching/gas    HPI:   Debra Spencer is a 84 y.o. female who presents for follow-up on nausea and GERD.  Patient was last seen in our office 09/28/2017 for GERD, odynophagia, nausea without vomiting.  History of postsurgical nausea and decreased appetite with 15 to 20 pound weight loss in the setting of significant stress.  CT of the abdomen and pelvis previously done found no concerning GI symptoms.  Her weight did stabilize.  Her main source of stress was her husband's passing from pancreatic cancer and June 2018.  Primary care ordered CT of the chest which found scattered pulmonary nodules with the largest in the right lower lobe measuring 5 mm.  Deemed no follow-up necessary patient is low risk, recommended noncontrast CT of the chest in 12 months if patient is high risk.  At her last visit her nausea was improved on gingerroot and noted some throat fullness and significant productive cough with phlegm.  States she gets "strangled really easily" but denies overt dysphagia.  Some lower esophageal/epigastric odynophagia which is brief with certain foods and liquids.  No other GI symptoms.  Recommended continue current medications, barium pill esophagram, follow-up in 3 months.  Barium pill esophagram completed 10/12/2017 which found no evidence of esophageal mass or stricture, single tiny erosion versus forme fruste of a diverticulum in the distal esophagus, mild piriform sinus residuals cleared by second swallow.  Today she states she's doing well overall. Nausea is a little better. Still taking Ginger Root which helps her nausea. Still with significant thick, phlegm that she spits up a lot. No bitter sour/bile regurgitation. Rare to minimal heartburs symptoms. Denies  abdominal pain, vomiting, hematochezia, melena, fever, chills, unintentional weight loss. Appetite is "so-so". She eats good (3 meals a day). Her friend says she loves sweets. Objectively weight is stable. She has morning time diarrhea when she eats a lot of fiber foods (bran, oatmeal). If she stops that she will get constipated. Denies chest pain, dyspnea, dizziness, lightheadedness, syncope, near syncope. Denies any other upper or lower GI symptoms.   Past Medical History:  Diagnosis Date  . Anxiety    hx   . Cerebrovascular disease    nonobstructive. carotid dopplers, July 2008  . Coronary atherosclerosis    mild; treated medically. neg asenosine stress cardiolite, EF 77% 6/08. low risk exercise stress cardiolite 11/06. sugg. of possib. small area  of apical ischemia. minor luminal irreg., 10-20% ostial LAD stenosis at cardiac cath 11/09.   . Diverticulitis   . Drug intolerance    hx of nitrate intolerance   . Dyslipidemia    intolerant to simvastatin  . GERD (gastroesophageal reflux disease)     Past Surgical History:  Procedure Laterality Date  . APPENDECTOMY    . BACK SURGERY  12/2016   L4-L5  . CHOLECYSTECTOMY    . TONSILLECTOMY      Current Outpatient Medications  Medication Sig Dispense Refill  . acetaminophen (TYLENOL) 500 MG tablet Take 1,000 mg by mouth every 6 (six) hours as needed.    Marland Kitchen alendronate (FOSAMAX) 70 MG tablet Take 1 tablet by mouth once a week. Take 1 tab once a week    . ASPIRIN 81 PO Take 81 mg by  mouth daily.    . Calcium-Vitamin D 600-200 MG-UNIT per tablet Take 1 tablet by mouth daily. 600-200?     Marland Kitchen cholecalciferol (VITAMIN D) 1000 units tablet Take 1,000 Units by mouth daily.    . Ginger, Zingiber officinalis, (GINGER ROOT PO) Take daily by mouth.    Marland Kitchen ibuprofen (ADVIL,MOTRIN) 200 MG tablet Take 400 mg by mouth every 6 (six) hours as needed.    . latanoprost (XALATAN) 0.005 % ophthalmic solution Place 1 drop into both eyes at bedtime.    .  metoprolol tartrate (LOPRESSOR) 50 MG tablet TAKE 1 TABLET BY MOUTH TWICE DAILY 180 tablet 3  . Multiple Vitamin (MULTIVITAMIN) tablet Take 1 tablet by mouth daily.      . Omega-3 Fatty Acids (FISH OIL PO) Take by mouth 2 (two) times daily.     . pantoprazole (PROTONIX) 40 MG tablet Take 40 mg by mouth daily.    . nitroGLYCERIN (NITROSTAT) 0.4 MG SL tablet Place 1 tablet (0.4 mg total) under the tongue every 5 (five) minutes as needed for chest pain. 25 tablet 3   No current facility-administered medications for this visit.     Allergies as of 12/30/2017 - Review Complete 12/30/2017  Allergen Reaction Noted  . Amoxicillin Nausea And Vomiting   . Erythromycin Nausea And Vomiting   . Nitrofurantoin Nausea And Vomiting   . Other  09/28/2017    Family History  Problem Relation Age of Onset  . Colon cancer Neg Hx     Social History   Socioeconomic History  . Marital status: Widowed    Spouse name: Not on file  . Number of children: Not on file  . Years of education: Not on file  . Highest education level: Not on file  Occupational History  . Not on file  Social Needs  . Financial resource strain: Not on file  . Food insecurity:    Worry: Not on file    Inability: Not on file  . Transportation needs:    Medical: Not on file    Non-medical: Not on file  Tobacco Use  . Smoking status: Never Smoker  . Smokeless tobacco: Never Used  . Tobacco comment: tobacco use - no   Substance and Sexual Activity  . Alcohol use: No    Alcohol/week: 0.0 oz  . Drug use: No  . Sexual activity: Not on file  Lifestyle  . Physical activity:    Days per week: Not on file    Minutes per session: Not on file  . Stress: Not on file  Relationships  . Social connections:    Talks on phone: Not on file    Gets together: Not on file    Attends religious service: Not on file    Active member of club or organization: Not on file    Attends meetings of clubs or organizations: Not on file     Relationship status: Not on file  Other Topics Concern  . Not on file  Social History Narrative   Married, retired.     Review of Systems: General: Negative for anorexia, weight loss, fever, chills, fatigue, weakness. ENT: Negative for hoarseness, difficulty swallowing. notes increased phlegm. CV: Negative for chest pain, angina, palpitations, peripheral edema.  Respiratory: Negative for dyspnea at rest, cough, wheezing.  GI: See history of present illness. Endo: Negative for unusual weight change.  Heme: Negative for bruising or bleeding. Allergy: Negative for rash or hives.   Physical Exam: BP (!) 169/78  Pulse 80   Temp (!) 97 F (36.1 C) (Oral)   Ht 5\' 4"  (1.626 m)   Wt 143 lb 9.6 oz (65.1 kg)   BMI 24.65 kg/m  General:   Alert and oriented. Pleasant and cooperative. Well-nourished and well-developed.  Eyes:  Without icterus, sclera clear and conjunctiva pink.  Ears:  Normal auditory acuity. Cardiovascular:  S1, S2 present without murmurs appreciated. Extremities without clubbing or edema. Respiratory:  Clear to auscultation bilaterally. No wheezes, rales, or rhonchi. No distress.  Gastrointestinal:  +BS, soft, non-tender and non-distended. No HSM noted. No guarding or rebound. No masses appreciated.  Rectal:  Deferred  Musculoskalatal:  Symmetrical without gross deformities. Neurologic:  Alert and oriented x4;  grossly normal neurologically. Psych:  Alert and cooperative. Normal mood and affect. Heme/Lymph/Immune: No excessive bruising noted.    12/30/2017 10:08 AM   Disclaimer: This note was dictated with voice recognition software. Similar sounding words can inadvertently be transcribed and may not be corrected upon review.

## 2017-12-30 NOTE — Progress Notes (Signed)
cc'ed to pcp °

## 2017-12-30 NOTE — Patient Instructions (Signed)
1. I am glad you are doing well! 2. To need taking your current medications. 3. Continue gingerroot to help with nausea. 4. Return for follow-up in 6 months. 5. Call us if you have any questions or concerns.  At [facility] we value your feedback. You may receive a survey about your visit today. Please share your experience as we strive to create trusting relationships with our patients to provide genuine, compassionate, quality care.  It was great to meet you today!  I hope you have a great summer!!

## 2018-03-12 DIAGNOSIS — I10 Essential (primary) hypertension: Secondary | ICD-10-CM | POA: Diagnosis not present

## 2018-03-12 DIAGNOSIS — Z299 Encounter for prophylactic measures, unspecified: Secondary | ICD-10-CM | POA: Diagnosis not present

## 2018-03-12 DIAGNOSIS — M1710 Unilateral primary osteoarthritis, unspecified knee: Secondary | ICD-10-CM | POA: Diagnosis not present

## 2018-03-12 DIAGNOSIS — M810 Age-related osteoporosis without current pathological fracture: Secondary | ICD-10-CM | POA: Diagnosis not present

## 2018-03-12 DIAGNOSIS — Z6824 Body mass index (BMI) 24.0-24.9, adult: Secondary | ICD-10-CM | POA: Diagnosis not present

## 2018-03-12 DIAGNOSIS — E785 Hyperlipidemia, unspecified: Secondary | ICD-10-CM | POA: Diagnosis not present

## 2018-04-16 DIAGNOSIS — H401231 Low-tension glaucoma, bilateral, mild stage: Secondary | ICD-10-CM | POA: Diagnosis not present

## 2018-04-16 DIAGNOSIS — Z961 Presence of intraocular lens: Secondary | ICD-10-CM | POA: Diagnosis not present

## 2018-04-16 DIAGNOSIS — H04123 Dry eye syndrome of bilateral lacrimal glands: Secondary | ICD-10-CM | POA: Diagnosis not present

## 2018-04-16 DIAGNOSIS — H35371 Puckering of macula, right eye: Secondary | ICD-10-CM | POA: Diagnosis not present

## 2018-04-21 DIAGNOSIS — Z299 Encounter for prophylactic measures, unspecified: Secondary | ICD-10-CM | POA: Diagnosis not present

## 2018-04-21 DIAGNOSIS — K219 Gastro-esophageal reflux disease without esophagitis: Secondary | ICD-10-CM | POA: Diagnosis not present

## 2018-04-21 DIAGNOSIS — I10 Essential (primary) hypertension: Secondary | ICD-10-CM | POA: Diagnosis not present

## 2018-04-21 DIAGNOSIS — E785 Hyperlipidemia, unspecified: Secondary | ICD-10-CM | POA: Diagnosis not present

## 2018-04-21 DIAGNOSIS — M25511 Pain in right shoulder: Secondary | ICD-10-CM | POA: Diagnosis not present

## 2018-05-12 DIAGNOSIS — E785 Hyperlipidemia, unspecified: Secondary | ICD-10-CM | POA: Diagnosis not present

## 2018-05-12 DIAGNOSIS — Z6825 Body mass index (BMI) 25.0-25.9, adult: Secondary | ICD-10-CM | POA: Diagnosis not present

## 2018-05-12 DIAGNOSIS — Z299 Encounter for prophylactic measures, unspecified: Secondary | ICD-10-CM | POA: Diagnosis not present

## 2018-05-12 DIAGNOSIS — I10 Essential (primary) hypertension: Secondary | ICD-10-CM | POA: Diagnosis not present

## 2018-06-23 DIAGNOSIS — Z789 Other specified health status: Secondary | ICD-10-CM | POA: Diagnosis not present

## 2018-06-23 DIAGNOSIS — I10 Essential (primary) hypertension: Secondary | ICD-10-CM | POA: Diagnosis not present

## 2018-06-23 DIAGNOSIS — Z299 Encounter for prophylactic measures, unspecified: Secondary | ICD-10-CM | POA: Diagnosis not present

## 2018-06-23 DIAGNOSIS — Z6826 Body mass index (BMI) 26.0-26.9, adult: Secondary | ICD-10-CM | POA: Diagnosis not present

## 2018-07-05 ENCOUNTER — Ambulatory Visit: Payer: PPO | Admitting: Nurse Practitioner

## 2018-07-19 DIAGNOSIS — I10 Essential (primary) hypertension: Secondary | ICD-10-CM | POA: Diagnosis not present

## 2018-07-19 DIAGNOSIS — Z6826 Body mass index (BMI) 26.0-26.9, adult: Secondary | ICD-10-CM | POA: Diagnosis not present

## 2018-07-19 DIAGNOSIS — Z789 Other specified health status: Secondary | ICD-10-CM | POA: Diagnosis not present

## 2018-07-19 DIAGNOSIS — Z299 Encounter for prophylactic measures, unspecified: Secondary | ICD-10-CM | POA: Diagnosis not present

## 2018-07-19 DIAGNOSIS — J209 Acute bronchitis, unspecified: Secondary | ICD-10-CM | POA: Diagnosis not present

## 2018-07-23 DIAGNOSIS — I10 Essential (primary) hypertension: Secondary | ICD-10-CM | POA: Diagnosis not present

## 2018-07-23 DIAGNOSIS — Z789 Other specified health status: Secondary | ICD-10-CM | POA: Diagnosis not present

## 2018-07-23 DIAGNOSIS — Z299 Encounter for prophylactic measures, unspecified: Secondary | ICD-10-CM | POA: Diagnosis not present

## 2018-07-23 DIAGNOSIS — R1310 Dysphagia, unspecified: Secondary | ICD-10-CM | POA: Diagnosis not present

## 2018-07-23 DIAGNOSIS — Z6826 Body mass index (BMI) 26.0-26.9, adult: Secondary | ICD-10-CM | POA: Diagnosis not present

## 2018-07-26 ENCOUNTER — Encounter: Payer: Self-pay | Admitting: Internal Medicine

## 2018-08-05 ENCOUNTER — Ambulatory Visit (INDEPENDENT_AMBULATORY_CARE_PROVIDER_SITE_OTHER): Payer: PPO | Admitting: Otolaryngology

## 2018-08-05 DIAGNOSIS — K219 Gastro-esophageal reflux disease without esophagitis: Secondary | ICD-10-CM

## 2018-08-05 DIAGNOSIS — R070 Pain in throat: Secondary | ICD-10-CM | POA: Diagnosis not present

## 2018-08-26 ENCOUNTER — Telehealth: Payer: Self-pay | Admitting: Cardiovascular Disease

## 2018-08-26 NOTE — Telephone Encounter (Signed)
New message   Pts daughter is calling about the upcoming appt on 09/03/18 and she is not sure if the Pt would be able to do a virtual visit.  Please call

## 2018-08-27 DIAGNOSIS — S42201A Unspecified fracture of upper end of right humerus, initial encounter for closed fracture: Secondary | ICD-10-CM | POA: Diagnosis not present

## 2018-08-27 DIAGNOSIS — R42 Dizziness and giddiness: Secondary | ICD-10-CM | POA: Diagnosis not present

## 2018-08-27 DIAGNOSIS — R358 Other polyuria: Secondary | ICD-10-CM | POA: Diagnosis not present

## 2018-08-27 DIAGNOSIS — Z881 Allergy status to other antibiotic agents status: Secondary | ICD-10-CM | POA: Diagnosis not present

## 2018-08-27 DIAGNOSIS — Z7982 Long term (current) use of aspirin: Secondary | ICD-10-CM | POA: Diagnosis not present

## 2018-08-27 DIAGNOSIS — Z87442 Personal history of urinary calculi: Secondary | ICD-10-CM | POA: Diagnosis not present

## 2018-08-27 DIAGNOSIS — W1839XA Other fall on same level, initial encounter: Secondary | ICD-10-CM | POA: Diagnosis not present

## 2018-08-27 DIAGNOSIS — F418 Other specified anxiety disorders: Secondary | ICD-10-CM | POA: Diagnosis not present

## 2018-08-27 DIAGNOSIS — E785 Hyperlipidemia, unspecified: Secondary | ICD-10-CM | POA: Diagnosis not present

## 2018-08-27 DIAGNOSIS — Z96642 Presence of left artificial hip joint: Secondary | ICD-10-CM | POA: Diagnosis not present

## 2018-08-27 DIAGNOSIS — M1611 Unilateral primary osteoarthritis, right hip: Secondary | ICD-10-CM | POA: Diagnosis not present

## 2018-08-27 DIAGNOSIS — M47812 Spondylosis without myelopathy or radiculopathy, cervical region: Secondary | ICD-10-CM | POA: Diagnosis not present

## 2018-08-27 DIAGNOSIS — Z888 Allergy status to other drugs, medicaments and biological substances status: Secondary | ICD-10-CM | POA: Diagnosis not present

## 2018-08-27 DIAGNOSIS — N1831 Chronic kidney disease, stage 3a: Secondary | ICD-10-CM | POA: Diagnosis not present

## 2018-08-27 DIAGNOSIS — M25511 Pain in right shoulder: Secondary | ICD-10-CM | POA: Diagnosis not present

## 2018-08-27 DIAGNOSIS — K219 Gastro-esophageal reflux disease without esophagitis: Secondary | ICD-10-CM | POA: Diagnosis not present

## 2018-08-27 DIAGNOSIS — Z1159 Encounter for screening for other viral diseases: Secondary | ICD-10-CM | POA: Diagnosis not present

## 2018-08-27 DIAGNOSIS — K5900 Constipation, unspecified: Secondary | ICD-10-CM | POA: Diagnosis not present

## 2018-08-27 DIAGNOSIS — R2242 Localized swelling, mass and lump, left lower limb: Secondary | ICD-10-CM | POA: Diagnosis not present

## 2018-08-27 DIAGNOSIS — I10 Essential (primary) hypertension: Secondary | ICD-10-CM | POA: Diagnosis not present

## 2018-08-27 DIAGNOSIS — Z471 Aftercare following joint replacement surgery: Secondary | ICD-10-CM | POA: Diagnosis not present

## 2018-08-27 DIAGNOSIS — F1020 Alcohol dependence, uncomplicated: Secondary | ICD-10-CM | POA: Diagnosis not present

## 2018-08-27 DIAGNOSIS — F419 Anxiety disorder, unspecified: Secondary | ICD-10-CM | POA: Diagnosis not present

## 2018-08-27 DIAGNOSIS — R2681 Unsteadiness on feet: Secondary | ICD-10-CM | POA: Diagnosis not present

## 2018-08-27 DIAGNOSIS — Z85828 Personal history of other malignant neoplasm of skin: Secondary | ICD-10-CM | POA: Diagnosis not present

## 2018-08-27 DIAGNOSIS — F909 Attention-deficit hyperactivity disorder, unspecified type: Secondary | ICD-10-CM | POA: Diagnosis not present

## 2018-08-27 DIAGNOSIS — I2510 Atherosclerotic heart disease of native coronary artery without angina pectoris: Secondary | ICD-10-CM | POA: Diagnosis not present

## 2018-08-27 DIAGNOSIS — M1990 Unspecified osteoarthritis, unspecified site: Secondary | ICD-10-CM | POA: Diagnosis not present

## 2018-08-27 DIAGNOSIS — Z880 Allergy status to penicillin: Secondary | ICD-10-CM | POA: Diagnosis not present

## 2018-08-27 DIAGNOSIS — Z87891 Personal history of nicotine dependence: Secondary | ICD-10-CM | POA: Diagnosis not present

## 2018-08-27 DIAGNOSIS — F319 Bipolar disorder, unspecified: Secondary | ICD-10-CM | POA: Diagnosis not present

## 2018-08-27 DIAGNOSIS — Z79899 Other long term (current) drug therapy: Secondary | ICD-10-CM | POA: Diagnosis not present

## 2018-08-27 DIAGNOSIS — D649 Anemia, unspecified: Secondary | ICD-10-CM | POA: Diagnosis not present

## 2018-08-27 DIAGNOSIS — Z9181 History of falling: Secondary | ICD-10-CM | POA: Diagnosis not present

## 2018-09-01 ENCOUNTER — Other Ambulatory Visit: Payer: Self-pay

## 2018-09-02 DIAGNOSIS — H02845 Edema of left lower eyelid: Secondary | ICD-10-CM | POA: Diagnosis not present

## 2018-09-02 DIAGNOSIS — Z87898 Personal history of other specified conditions: Secondary | ICD-10-CM | POA: Diagnosis not present

## 2018-09-02 DIAGNOSIS — I10 Essential (primary) hypertension: Secondary | ICD-10-CM | POA: Diagnosis not present

## 2018-09-02 DIAGNOSIS — Z86010 Personal history of colonic polyps: Secondary | ICD-10-CM | POA: Diagnosis not present

## 2018-09-02 DIAGNOSIS — K219 Gastro-esophageal reflux disease without esophagitis: Secondary | ICD-10-CM | POA: Diagnosis not present

## 2018-09-02 DIAGNOSIS — Z299 Encounter for prophylactic measures, unspecified: Secondary | ICD-10-CM | POA: Diagnosis not present

## 2018-09-03 ENCOUNTER — Encounter: Payer: Self-pay | Admitting: Cardiovascular Disease

## 2018-09-03 ENCOUNTER — Other Ambulatory Visit: Payer: Self-pay

## 2018-09-03 ENCOUNTER — Telehealth (INDEPENDENT_AMBULATORY_CARE_PROVIDER_SITE_OTHER): Payer: PPO | Admitting: Cardiovascular Disease

## 2018-09-03 DIAGNOSIS — R079 Chest pain, unspecified: Secondary | ICD-10-CM

## 2018-09-03 DIAGNOSIS — I10 Essential (primary) hypertension: Secondary | ICD-10-CM

## 2018-09-03 DIAGNOSIS — K2100 Gastro-esophageal reflux disease with esophagitis, without bleeding: Secondary | ICD-10-CM

## 2018-09-03 MED ORDER — LOSARTAN POTASSIUM 25 MG PO TABS: 25.0000 mg | ORAL_TABLET | Freq: Every day | ORAL | 11 refills | Status: DC

## 2018-09-03 NOTE — Patient Instructions (Addendum)
Medication Instructions:  Start Losartan 25 mg daily (since Valsartan is on backorder)   If you need a refill on your cardiac medications before your next appointment, please call your pharmacy.    Follow-Up: At [facility], you and your health needs are our priority.  As part of our continuing mission to provide you with exceptional heart care, we have created designated Provider Care Teams.  These Care Teams include your primary Cardiologist (physician) and Advanced Practice Providers (APPs -  Physician Assistants and Nurse Practitioners) who all work together to provide you with the care you need, when you need it. You will need a follow up appointment in 12 months.  Please call our office 2 months in advance to schedule this appointment.  You may see Sanda Klein, MD or one of the following Advanced Practice Providers on your designated Care Team: [place], [place] . Fabian Sharp, PA-C

## 2018-09-03 NOTE — Progress Notes (Signed)
Virtual Visit via Video Note   This visit type was conducted due to national recommendations for restrictions regarding the COVID-19 Pandemic (e.g. social distancing) in an effort to limit this patient's exposure and mitigate transmission in our community.  Due to her co-morbid illnesses, this patient is at least at moderate risk for complications without adequate follow up.  This format is felt to be most appropriate for this patient at this time.  All issues noted in this document were discussed and addressed.  A limited physical exam was performed with this format.  Please refer to the patient's chart for her consent to telehealth for [facility].   Evaluation Performed:  Follow-up visit  Date:  09/03/2018   ID:  Debra, Spencer [birth year], MRN [id number]  Patient Location: Home  Provider Location: Home  PCP:  Glenda Chroman, MD  Cardiologist:  Sanda Klein, MD  Electrophysiologist:  None   Chief Complaint: Hypertension  History of Present Illness:    Debra Spencer is a 85 y.o. female who presents via audio/video conferencing for a telehealth visit today.    [name] has a history of hypertension and minor coronary artery disease, hypercholesterolemia poorly tolerant of statin therapy and atypical chest discomfort.  She is generally doing well.  She tripped over her a damaged portion of her driveway while taking out the trash can and fractured her shoulder.  She is wearing a sling and did not require surgery.  This occurred last Friday and she is already doing much better and only needs Tylenol for pain relief.  She still has occasional problems with chest discomfort when she lies in bed at night.  This is alleviated by rubbing diclofenac over her chest.  She does not have chest discomfort with activity.  She denies any issues with palpitations, shortness of breath, leg edema, claudication or focal neurological events.  She is chronically hard of hearing.  She continues  to live independently with the help of her daughter who participated in today's phone call.  Her blood pressure control has been fair with Dr. Woody Seller did have to add a low-dose of valsartan for blood pressure control.  She has had difficulty finding that medication in the last couple of days due to manufacturing related back orders.  Her blood pressure range over the last week is 106/64-150/68 with heart rates in the 60s and 70s.  Most of her blood pressure readings are in the 110-120/60 range.  She had a normal nuclear stress test in 2016 without any evidence of ischemia and ejection fraction 66%.  The patient does not have symptoms concerning for COVID-19 infection (fever, chills, cough, or new shortness of breath).    Past Medical History:  Diagnosis Date   Anxiety    hx    Cerebrovascular disease    nonobstructive. carotid dopplers, July 2008   Coronary atherosclerosis    mild; treated medically. neg asenosine stress cardiolite, EF 77% 6/08. low risk exercise stress cardiolite 11/06. sugg. of possib. small area  of apical ischemia. minor luminal irreg., 10-20% ostial LAD stenosis at cardiac cath 11/09.    Diverticulitis    Drug intolerance    hx of nitrate intolerance    Dyslipidemia    intolerant to simvastatin   GERD (gastroesophageal reflux disease)    Past Surgical History:  Procedure Laterality Date   APPENDECTOMY     BACK SURGERY  12/2016   L4-L5   CHOLECYSTECTOMY     TONSILLECTOMY  Current Meds  Medication Sig   acetaminophen (TYLENOL) 500 MG tablet Take 1,000 mg by mouth every 6 (six) hours as needed.   alendronate (FOSAMAX) 70 MG tablet Take 1 tablet by mouth once a week. Take 1 tab once a week   Calcium-Vitamin D 600-200 MG-UNIT per tablet Take 1 tablet by mouth daily. 600-200?    Ginger, Zingiber officinalis, (GINGER ROOT PO) Take daily by mouth.   latanoprost (XALATAN) 0.005 % ophthalmic solution Place 1 drop into both eyes at bedtime.    metoprolol tartrate (LOPRESSOR) 50 MG tablet TAKE 1 TABLET BY MOUTH TWICE DAILY   Multiple Vitamin (MULTIVITAMIN) tablet Take 1 tablet by mouth daily.     Omega-3 Fatty Acids (FISH OIL PO) Take by mouth 2 (two) times daily.    pantoprazole (PROTONIX) 40 MG tablet Take 40 mg by mouth daily.   valsartan (DIOVAN) 80 MG tablet as needed.      Allergies:   Amoxicillin; Erythromycin; Nitrofurantoin; and Other   Social History   Tobacco Use   Smoking status: Never Smoker   Smokeless tobacco: Never Used   Tobacco comment: tobacco use - no   Substance Use Topics   Alcohol use: No    Alcohol/week: 0.0 standard drinks   Drug use: No     Family Hx: The patient's family history is negative for Colon cancer.  ROS:   Please see the history of present illness.     All other systems reviewed and are negative.   Prior CV studies:   The following studies were reviewed today:   Labs/Other Tests and Data Reviewed:    EKG:  An ECG dated 08/17/2017 was personally reviewed today and demonstrated:  Normal sinus rhythm, normal tracing except for a QS pattern in leads V1-V2  Recent Labs: No results found for requested labs within last 8760 hours.   Recent Lipid Panel No results found for: CHOL, TRIG, HDL, CHOLHDL, LDLCALC, LDLDIRECT  Wt Readings from Last 3 Encounters:  12/30/17 143 lb 9.6 oz (65.1 kg)  09/28/17 142 lb (64.4 kg)  08/17/17 145 lb 3.2 oz (65.9 kg)     Objective:    Vital Signs:  BP (!) 146/75    Pulse 88    Ht 5\' 4"  (1.626 m)    BMI 24.65 kg/m    Well nourished, well developed female in no acute distress. Hard of hearing, but was smiling, appears comfortable and eagerly participated in the video conference.  No evidence of hypervolemia was noted on the limited visual exam.  ASSESSMENT & PLAN:    1. HTN: Her blood pressure was mildly elevated today, but on the average her blood pressure control is excellent.  She has been unable to get valsartan for the last  couple of days due to non-factoring for her.  I gave her a temporary prescription for losartan 25 mg.  She understands that she should not take both valsartan and losartan at this time. 2. Atypical chest discomfort: This occurs at rest, not with activity and is alleviated with topical diclofenac.  It is consistent with costochondritis. 3. GERD/History of esophageal stenosis-esophagoplasty  4. Recent fall/ shoulder fracture  COVID-19 Education: The signs and symptoms of COVID-19 were discussed with the patient and how to seek care for testing (follow up with PCP or arrange E-visit).  The importance of social distancing was discussed today.  Time:   Today, I have spent 20 minutes with the patient with telehealth technology discussing the above problems.  Medication Adjustments/Labs and Tests Ordered: Current medicines are reviewed at length with the patient today.  Concerns regarding medicines are outlined above.  Tests Ordered: No orders of the defined types were placed in this encounter.  Medication Changes: No orders of the defined types were placed in this encounter.   Disposition:  Follow up 12 months  Signed, Sanda Klein, MD  09/03/2018 8:42 AM    [facility]

## 2018-09-08 DIAGNOSIS — S42214A Unspecified nondisplaced fracture of surgical neck of right humerus, initial encounter for closed fracture: Secondary | ICD-10-CM | POA: Diagnosis not present

## 2018-09-08 DIAGNOSIS — W010XXA Fall on same level from slipping, tripping and stumbling without subsequent striking against object, initial encounter: Secondary | ICD-10-CM | POA: Diagnosis not present

## 2018-09-08 DIAGNOSIS — S42211A Unspecified displaced fracture of surgical neck of right humerus, initial encounter for closed fracture: Secondary | ICD-10-CM | POA: Diagnosis not present

## 2018-09-15 ENCOUNTER — Telehealth: Payer: Self-pay

## 2018-09-15 NOTE — Telephone Encounter (Signed)
Recent lab requested from PCP Dr.Dhruv Vyas.

## 2018-09-22 DIAGNOSIS — W19XXXA Unspecified fall, initial encounter: Secondary | ICD-10-CM | POA: Diagnosis not present

## 2018-09-22 DIAGNOSIS — M25552 Pain in left hip: Secondary | ICD-10-CM | POA: Diagnosis not present

## 2018-09-22 DIAGNOSIS — S42211D Unspecified displaced fracture of surgical neck of right humerus, subsequent encounter for fracture with routine healing: Secondary | ICD-10-CM | POA: Diagnosis not present

## 2018-09-27 DIAGNOSIS — M6281 Muscle weakness (generalized): Secondary | ICD-10-CM | POA: Diagnosis not present

## 2018-09-27 DIAGNOSIS — M25552 Pain in left hip: Secondary | ICD-10-CM | POA: Diagnosis not present

## 2018-09-27 DIAGNOSIS — M25611 Stiffness of right shoulder, not elsewhere classified: Secondary | ICD-10-CM | POA: Diagnosis not present

## 2018-09-27 DIAGNOSIS — M25511 Pain in right shoulder: Secondary | ICD-10-CM | POA: Diagnosis not present

## 2018-09-29 DIAGNOSIS — M25552 Pain in left hip: Secondary | ICD-10-CM | POA: Diagnosis not present

## 2018-09-29 DIAGNOSIS — M25511 Pain in right shoulder: Secondary | ICD-10-CM | POA: Diagnosis not present

## 2018-09-29 DIAGNOSIS — M6281 Muscle weakness (generalized): Secondary | ICD-10-CM | POA: Diagnosis not present

## 2018-09-29 DIAGNOSIS — M25611 Stiffness of right shoulder, not elsewhere classified: Secondary | ICD-10-CM | POA: Diagnosis not present

## 2018-09-30 ENCOUNTER — Ambulatory Visit: Payer: PPO | Admitting: Nurse Practitioner

## 2018-09-30 DIAGNOSIS — M25552 Pain in left hip: Secondary | ICD-10-CM | POA: Diagnosis not present

## 2018-09-30 DIAGNOSIS — M6281 Muscle weakness (generalized): Secondary | ICD-10-CM | POA: Diagnosis not present

## 2018-09-30 DIAGNOSIS — M25511 Pain in right shoulder: Secondary | ICD-10-CM | POA: Diagnosis not present

## 2018-09-30 DIAGNOSIS — M25611 Stiffness of right shoulder, not elsewhere classified: Secondary | ICD-10-CM | POA: Diagnosis not present

## 2018-10-04 DIAGNOSIS — M25611 Stiffness of right shoulder, not elsewhere classified: Secondary | ICD-10-CM | POA: Diagnosis not present

## 2018-10-04 DIAGNOSIS — M25511 Pain in right shoulder: Secondary | ICD-10-CM | POA: Diagnosis not present

## 2018-10-04 DIAGNOSIS — M25552 Pain in left hip: Secondary | ICD-10-CM | POA: Diagnosis not present

## 2018-10-04 DIAGNOSIS — M6281 Muscle weakness (generalized): Secondary | ICD-10-CM | POA: Diagnosis not present

## 2018-10-07 DIAGNOSIS — M6281 Muscle weakness (generalized): Secondary | ICD-10-CM | POA: Diagnosis not present

## 2018-10-07 DIAGNOSIS — M25511 Pain in right shoulder: Secondary | ICD-10-CM | POA: Diagnosis not present

## 2018-10-07 DIAGNOSIS — M25552 Pain in left hip: Secondary | ICD-10-CM | POA: Diagnosis not present

## 2018-10-07 DIAGNOSIS — M25611 Stiffness of right shoulder, not elsewhere classified: Secondary | ICD-10-CM | POA: Diagnosis not present

## 2018-10-08 DIAGNOSIS — M25552 Pain in left hip: Secondary | ICD-10-CM | POA: Diagnosis not present

## 2018-10-08 DIAGNOSIS — M25611 Stiffness of right shoulder, not elsewhere classified: Secondary | ICD-10-CM | POA: Diagnosis not present

## 2018-10-08 DIAGNOSIS — M25511 Pain in right shoulder: Secondary | ICD-10-CM | POA: Diagnosis not present

## 2018-10-08 DIAGNOSIS — M6281 Muscle weakness (generalized): Secondary | ICD-10-CM | POA: Diagnosis not present

## 2018-10-12 DIAGNOSIS — M25511 Pain in right shoulder: Secondary | ICD-10-CM | POA: Diagnosis not present

## 2018-10-12 DIAGNOSIS — M6281 Muscle weakness (generalized): Secondary | ICD-10-CM | POA: Diagnosis not present

## 2018-10-12 DIAGNOSIS — M25552 Pain in left hip: Secondary | ICD-10-CM | POA: Diagnosis not present

## 2018-10-12 DIAGNOSIS — M25611 Stiffness of right shoulder, not elsewhere classified: Secondary | ICD-10-CM | POA: Diagnosis not present

## 2018-10-13 DIAGNOSIS — M25552 Pain in left hip: Secondary | ICD-10-CM | POA: Diagnosis not present

## 2018-10-13 DIAGNOSIS — M25611 Stiffness of right shoulder, not elsewhere classified: Secondary | ICD-10-CM | POA: Diagnosis not present

## 2018-10-13 DIAGNOSIS — M6281 Muscle weakness (generalized): Secondary | ICD-10-CM | POA: Diagnosis not present

## 2018-10-13 DIAGNOSIS — M25511 Pain in right shoulder: Secondary | ICD-10-CM | POA: Diagnosis not present

## 2018-10-14 DIAGNOSIS — M25552 Pain in left hip: Secondary | ICD-10-CM | POA: Diagnosis not present

## 2018-10-14 DIAGNOSIS — M25511 Pain in right shoulder: Secondary | ICD-10-CM | POA: Diagnosis not present

## 2018-10-14 DIAGNOSIS — M6281 Muscle weakness (generalized): Secondary | ICD-10-CM | POA: Diagnosis not present

## 2018-10-14 DIAGNOSIS — M25611 Stiffness of right shoulder, not elsewhere classified: Secondary | ICD-10-CM | POA: Diagnosis not present

## 2018-10-15 DIAGNOSIS — H401230 Low-tension glaucoma, bilateral, stage unspecified: Secondary | ICD-10-CM | POA: Diagnosis not present

## 2018-10-15 DIAGNOSIS — H04123 Dry eye syndrome of bilateral lacrimal glands: Secondary | ICD-10-CM | POA: Diagnosis not present

## 2018-10-20 DIAGNOSIS — M6281 Muscle weakness (generalized): Secondary | ICD-10-CM | POA: Diagnosis not present

## 2018-10-20 DIAGNOSIS — M25511 Pain in right shoulder: Secondary | ICD-10-CM | POA: Diagnosis not present

## 2018-10-20 DIAGNOSIS — M25611 Stiffness of right shoulder, not elsewhere classified: Secondary | ICD-10-CM | POA: Diagnosis not present

## 2018-10-20 DIAGNOSIS — S42211D Unspecified displaced fracture of surgical neck of right humerus, subsequent encounter for fracture with routine healing: Secondary | ICD-10-CM | POA: Diagnosis not present

## 2018-10-20 DIAGNOSIS — M25552 Pain in left hip: Secondary | ICD-10-CM | POA: Diagnosis not present

## 2018-10-21 DIAGNOSIS — M25552 Pain in left hip: Secondary | ICD-10-CM | POA: Diagnosis not present

## 2018-10-21 DIAGNOSIS — M25511 Pain in right shoulder: Secondary | ICD-10-CM | POA: Diagnosis not present

## 2018-10-21 DIAGNOSIS — M25611 Stiffness of right shoulder, not elsewhere classified: Secondary | ICD-10-CM | POA: Diagnosis not present

## 2018-10-21 DIAGNOSIS — M6281 Muscle weakness (generalized): Secondary | ICD-10-CM | POA: Diagnosis not present

## 2018-10-22 DIAGNOSIS — M25611 Stiffness of right shoulder, not elsewhere classified: Secondary | ICD-10-CM | POA: Diagnosis not present

## 2018-10-22 DIAGNOSIS — M6281 Muscle weakness (generalized): Secondary | ICD-10-CM | POA: Diagnosis not present

## 2018-10-22 DIAGNOSIS — M25511 Pain in right shoulder: Secondary | ICD-10-CM | POA: Diagnosis not present

## 2018-10-22 DIAGNOSIS — M25552 Pain in left hip: Secondary | ICD-10-CM | POA: Diagnosis not present

## 2018-10-25 DIAGNOSIS — M25552 Pain in left hip: Secondary | ICD-10-CM | POA: Diagnosis not present

## 2018-10-25 DIAGNOSIS — M6281 Muscle weakness (generalized): Secondary | ICD-10-CM | POA: Diagnosis not present

## 2018-10-25 DIAGNOSIS — M25511 Pain in right shoulder: Secondary | ICD-10-CM | POA: Diagnosis not present

## 2018-10-25 DIAGNOSIS — M25611 Stiffness of right shoulder, not elsewhere classified: Secondary | ICD-10-CM | POA: Diagnosis not present

## 2018-10-27 DIAGNOSIS — M25611 Stiffness of right shoulder, not elsewhere classified: Secondary | ICD-10-CM | POA: Diagnosis not present

## 2018-10-27 DIAGNOSIS — M25552 Pain in left hip: Secondary | ICD-10-CM | POA: Diagnosis not present

## 2018-10-27 DIAGNOSIS — M25511 Pain in right shoulder: Secondary | ICD-10-CM | POA: Diagnosis not present

## 2018-10-27 DIAGNOSIS — M6281 Muscle weakness (generalized): Secondary | ICD-10-CM | POA: Diagnosis not present

## 2018-11-01 DIAGNOSIS — M6281 Muscle weakness (generalized): Secondary | ICD-10-CM | POA: Diagnosis not present

## 2018-11-01 DIAGNOSIS — M25552 Pain in left hip: Secondary | ICD-10-CM | POA: Diagnosis not present

## 2018-11-01 DIAGNOSIS — M25611 Stiffness of right shoulder, not elsewhere classified: Secondary | ICD-10-CM | POA: Diagnosis not present

## 2018-11-01 DIAGNOSIS — M25511 Pain in right shoulder: Secondary | ICD-10-CM | POA: Diagnosis not present

## 2018-11-03 DIAGNOSIS — M25552 Pain in left hip: Secondary | ICD-10-CM | POA: Diagnosis not present

## 2018-11-03 DIAGNOSIS — M25511 Pain in right shoulder: Secondary | ICD-10-CM | POA: Diagnosis not present

## 2018-11-03 DIAGNOSIS — M25611 Stiffness of right shoulder, not elsewhere classified: Secondary | ICD-10-CM | POA: Diagnosis not present

## 2018-11-03 DIAGNOSIS — M6281 Muscle weakness (generalized): Secondary | ICD-10-CM | POA: Diagnosis not present

## 2018-11-08 DIAGNOSIS — M25611 Stiffness of right shoulder, not elsewhere classified: Secondary | ICD-10-CM | POA: Diagnosis not present

## 2018-11-08 DIAGNOSIS — M25552 Pain in left hip: Secondary | ICD-10-CM | POA: Diagnosis not present

## 2018-11-08 DIAGNOSIS — M25511 Pain in right shoulder: Secondary | ICD-10-CM | POA: Diagnosis not present

## 2018-11-08 DIAGNOSIS — M6281 Muscle weakness (generalized): Secondary | ICD-10-CM | POA: Diagnosis not present

## 2018-11-10 DIAGNOSIS — M6281 Muscle weakness (generalized): Secondary | ICD-10-CM | POA: Diagnosis not present

## 2018-11-10 DIAGNOSIS — M25611 Stiffness of right shoulder, not elsewhere classified: Secondary | ICD-10-CM | POA: Diagnosis not present

## 2018-11-10 DIAGNOSIS — M25552 Pain in left hip: Secondary | ICD-10-CM | POA: Diagnosis not present

## 2018-11-10 DIAGNOSIS — M25511 Pain in right shoulder: Secondary | ICD-10-CM | POA: Diagnosis not present

## 2018-11-22 ENCOUNTER — Ambulatory Visit (INDEPENDENT_AMBULATORY_CARE_PROVIDER_SITE_OTHER): Payer: PPO | Admitting: Otolaryngology

## 2018-11-22 DIAGNOSIS — H6123 Impacted cerumen, bilateral: Secondary | ICD-10-CM | POA: Diagnosis not present

## 2018-11-22 DIAGNOSIS — R1312 Dysphagia, oropharyngeal phase: Secondary | ICD-10-CM

## 2018-11-22 DIAGNOSIS — R070 Pain in throat: Secondary | ICD-10-CM | POA: Diagnosis not present

## 2018-12-10 DIAGNOSIS — Z1339 Encounter for screening examination for other mental health and behavioral disorders: Secondary | ICD-10-CM | POA: Diagnosis not present

## 2018-12-10 DIAGNOSIS — Z1331 Encounter for screening for depression: Secondary | ICD-10-CM | POA: Diagnosis not present

## 2018-12-10 DIAGNOSIS — Z7189 Other specified counseling: Secondary | ICD-10-CM | POA: Diagnosis not present

## 2018-12-10 DIAGNOSIS — R5383 Other fatigue: Secondary | ICD-10-CM | POA: Diagnosis not present

## 2018-12-10 DIAGNOSIS — R200 Anesthesia of skin: Secondary | ICD-10-CM | POA: Diagnosis not present

## 2018-12-10 DIAGNOSIS — Z299 Encounter for prophylactic measures, unspecified: Secondary | ICD-10-CM | POA: Diagnosis not present

## 2018-12-10 DIAGNOSIS — Z6825 Body mass index (BMI) 25.0-25.9, adult: Secondary | ICD-10-CM | POA: Diagnosis not present

## 2018-12-10 DIAGNOSIS — Z0000 Encounter for general adult medical examination without abnormal findings: Secondary | ICD-10-CM | POA: Diagnosis not present

## 2018-12-10 DIAGNOSIS — I10 Essential (primary) hypertension: Secondary | ICD-10-CM | POA: Diagnosis not present

## 2018-12-10 DIAGNOSIS — E785 Hyperlipidemia, unspecified: Secondary | ICD-10-CM | POA: Diagnosis not present

## 2018-12-10 DIAGNOSIS — Z79899 Other long term (current) drug therapy: Secondary | ICD-10-CM | POA: Diagnosis not present

## 2018-12-10 DIAGNOSIS — Z1211 Encounter for screening for malignant neoplasm of colon: Secondary | ICD-10-CM | POA: Diagnosis not present

## 2018-12-16 DIAGNOSIS — Z1231 Encounter for screening mammogram for malignant neoplasm of breast: Secondary | ICD-10-CM | POA: Diagnosis not present

## 2018-12-29 DIAGNOSIS — Z6825 Body mass index (BMI) 25.0-25.9, adult: Secondary | ICD-10-CM | POA: Diagnosis not present

## 2018-12-29 DIAGNOSIS — Z981 Arthrodesis status: Secondary | ICD-10-CM | POA: Diagnosis not present

## 2018-12-29 DIAGNOSIS — Z299 Encounter for prophylactic measures, unspecified: Secondary | ICD-10-CM | POA: Diagnosis not present

## 2018-12-29 DIAGNOSIS — N390 Urinary tract infection, site not specified: Secondary | ICD-10-CM | POA: Diagnosis not present

## 2018-12-29 DIAGNOSIS — M545 Low back pain: Secondary | ICD-10-CM | POA: Diagnosis not present

## 2018-12-29 DIAGNOSIS — I10 Essential (primary) hypertension: Secondary | ICD-10-CM | POA: Diagnosis not present

## 2018-12-29 DIAGNOSIS — R300 Dysuria: Secondary | ICD-10-CM | POA: Diagnosis not present

## 2018-12-29 DIAGNOSIS — E785 Hyperlipidemia, unspecified: Secondary | ICD-10-CM | POA: Diagnosis not present

## 2019-01-03 ENCOUNTER — Other Ambulatory Visit: Payer: Self-pay | Admitting: Cardiovascular Disease

## 2019-01-19 DIAGNOSIS — M545 Low back pain: Secondary | ICD-10-CM | POA: Diagnosis not present

## 2019-01-24 DIAGNOSIS — Z961 Presence of intraocular lens: Secondary | ICD-10-CM | POA: Diagnosis not present

## 2019-01-24 DIAGNOSIS — H04123 Dry eye syndrome of bilateral lacrimal glands: Secondary | ICD-10-CM | POA: Diagnosis not present

## 2019-01-24 DIAGNOSIS — I10 Essential (primary) hypertension: Secondary | ICD-10-CM | POA: Diagnosis not present

## 2019-01-24 DIAGNOSIS — H35371 Puckering of macula, right eye: Secondary | ICD-10-CM | POA: Diagnosis not present

## 2019-01-24 DIAGNOSIS — H401230 Low-tension glaucoma, bilateral, stage unspecified: Secondary | ICD-10-CM | POA: Diagnosis not present

## 2019-01-24 DIAGNOSIS — K219 Gastro-esophageal reflux disease without esophagitis: Secondary | ICD-10-CM | POA: Diagnosis not present

## 2019-01-24 DIAGNOSIS — E785 Hyperlipidemia, unspecified: Secondary | ICD-10-CM | POA: Diagnosis not present

## 2019-01-24 DIAGNOSIS — M1710 Unilateral primary osteoarthritis, unspecified knee: Secondary | ICD-10-CM | POA: Diagnosis not present

## 2019-03-14 DIAGNOSIS — I10 Essential (primary) hypertension: Secondary | ICD-10-CM | POA: Diagnosis not present

## 2019-03-14 DIAGNOSIS — Z299 Encounter for prophylactic measures, unspecified: Secondary | ICD-10-CM | POA: Diagnosis not present

## 2019-03-14 DIAGNOSIS — Z713 Dietary counseling and surveillance: Secondary | ICD-10-CM | POA: Diagnosis not present

## 2019-03-14 DIAGNOSIS — Z6825 Body mass index (BMI) 25.0-25.9, adult: Secondary | ICD-10-CM | POA: Diagnosis not present

## 2019-03-30 DIAGNOSIS — M545 Low back pain: Secondary | ICD-10-CM | POA: Diagnosis not present

## 2019-03-30 DIAGNOSIS — I10 Essential (primary) hypertension: Secondary | ICD-10-CM | POA: Diagnosis not present

## 2019-03-30 DIAGNOSIS — M5441 Lumbago with sciatica, right side: Secondary | ICD-10-CM | POA: Diagnosis not present

## 2019-04-07 DIAGNOSIS — J069 Acute upper respiratory infection, unspecified: Secondary | ICD-10-CM | POA: Diagnosis not present

## 2019-04-07 DIAGNOSIS — R05 Cough: Secondary | ICD-10-CM | POA: Diagnosis not present

## 2019-04-07 DIAGNOSIS — Z299 Encounter for prophylactic measures, unspecified: Secondary | ICD-10-CM | POA: Diagnosis not present

## 2019-04-07 DIAGNOSIS — Z6825 Body mass index (BMI) 25.0-25.9, adult: Secondary | ICD-10-CM | POA: Diagnosis not present

## 2019-04-28 DIAGNOSIS — Z6825 Body mass index (BMI) 25.0-25.9, adult: Secondary | ICD-10-CM | POA: Diagnosis not present

## 2019-04-28 DIAGNOSIS — F322 Major depressive disorder, single episode, severe without psychotic features: Secondary | ICD-10-CM | POA: Diagnosis not present

## 2019-04-28 DIAGNOSIS — R410 Disorientation, unspecified: Secondary | ICD-10-CM | POA: Diagnosis not present

## 2019-04-28 DIAGNOSIS — I10 Essential (primary) hypertension: Secondary | ICD-10-CM | POA: Diagnosis not present

## 2019-04-28 DIAGNOSIS — R413 Other amnesia: Secondary | ICD-10-CM | POA: Diagnosis not present

## 2019-04-28 DIAGNOSIS — Z299 Encounter for prophylactic measures, unspecified: Secondary | ICD-10-CM | POA: Diagnosis not present

## 2019-04-30 IMAGING — RF DG ESOPHAGUS
10 of 11 series · 15 of 24 positions shown · non-contrast
Comparison: None

CLINICAL DATA: Chronic GERD, esophagitis, odynophagia, nausea

EXAM:
ESOPHOGRAM / BARIUM SWALLOW / BARIUM TABLET STUDY
TECHNIQUE: Combined double contrast and single contrast examination performed
using effervescent crystals, thick barium liquid, and thin barium
liquid. The patient was observed with fluoroscopy swallowing a 13 mm
barium sulphate tablet.
FLUOROSCOPY TIME:  Fluoroscopy Time:  1 minutes 18 seconds
Radiation Exposure Index (if provided by the fluoroscopic device):
13.9 mGy
Number of Acquired Spot Images: multiple fluoroscopic screen
captures

[Series 1: cp_standard · 0.18mm/px · 2 of 59 frames shown (1 of 10)]
[frame 9/59]
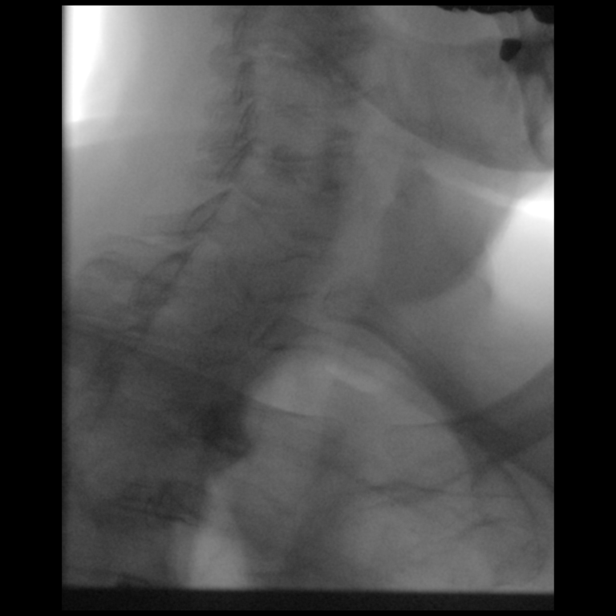
[frame 51/59]
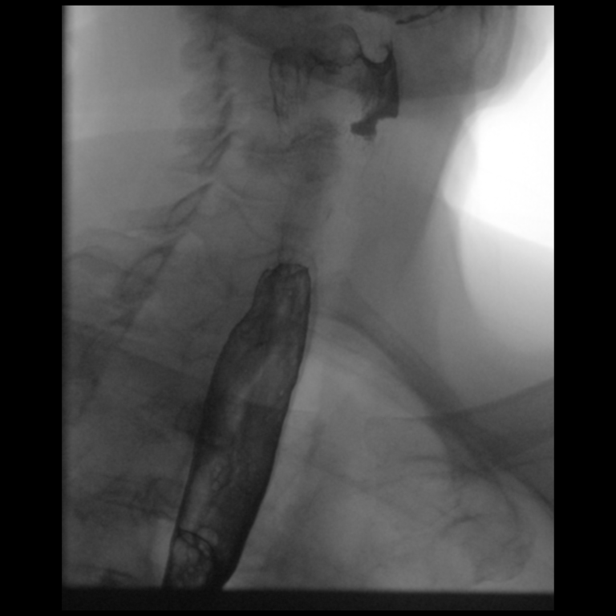

[Series 3: cp_standard · 0.18mm/px · 2 of 54 frames shown (2 of 10)]
[frame 9/54]
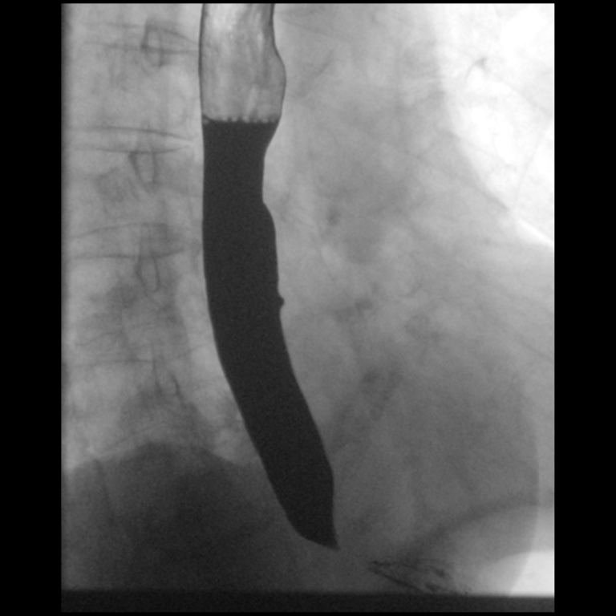
[frame 28/54]
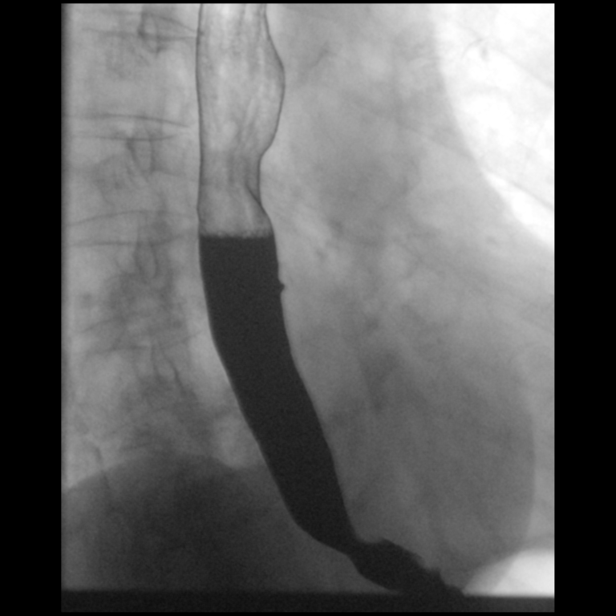

[Series 4: cp_standard · 0.18mm/px · 2 of 93 frames shown (3 of 10)]
[frame 14/93]
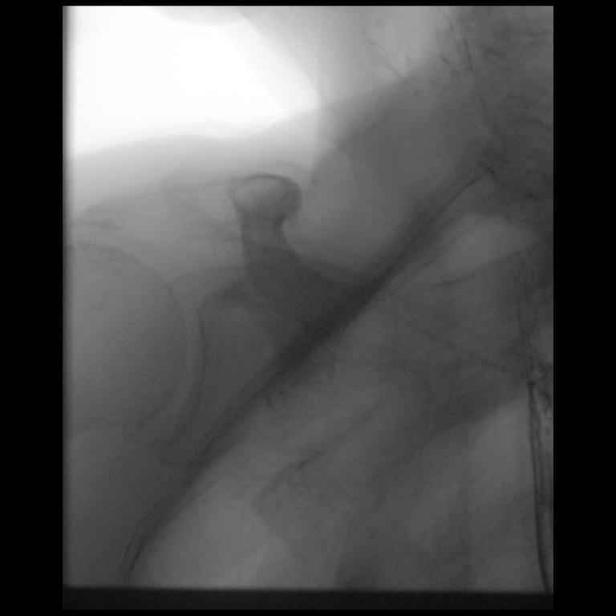
[frame 80/93]
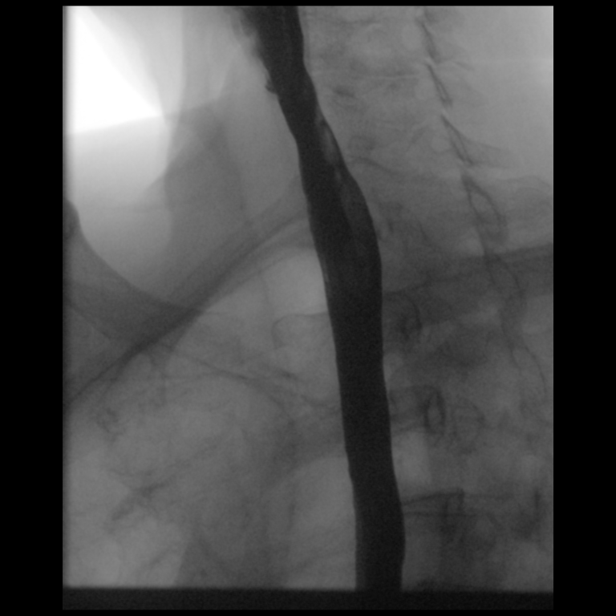

[Series 5: cp_standard · 0.18mm/px · 1 of 38 frames shown (4 of 10)]
[frame 20/38]
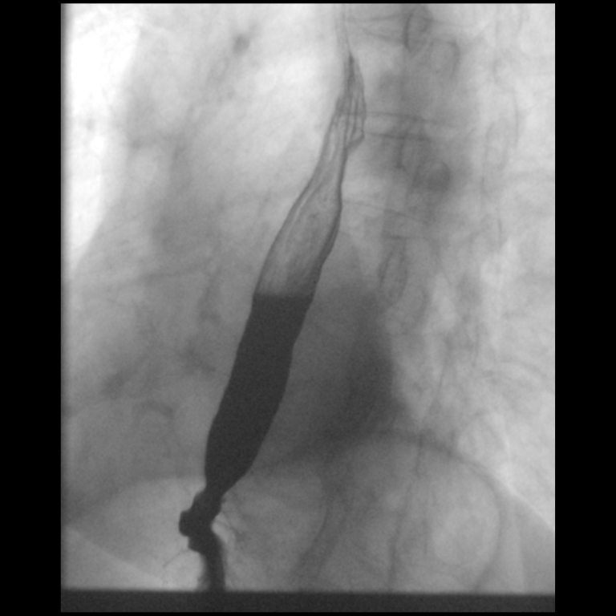

[Series 6: cp_standard · 0.27mm/px · 2 of 186 frames shown (5 of 10)]
[frame 28/186]
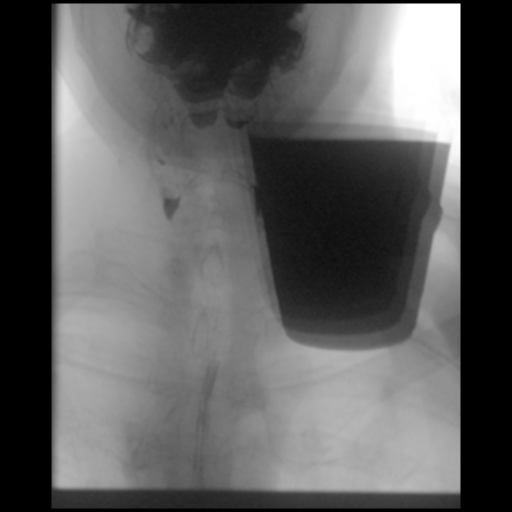
[frame 159/186]
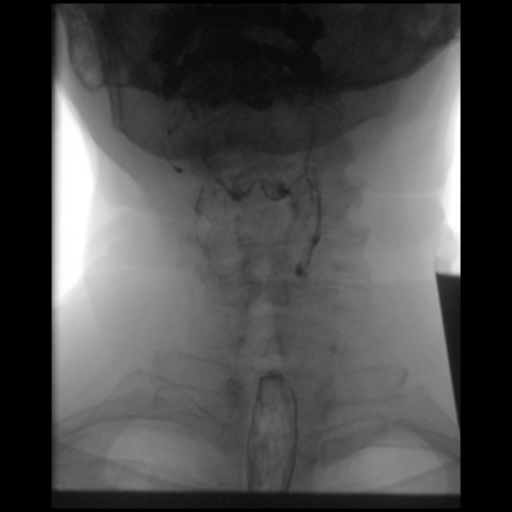

[Series 7: cp_standard · 0.26mm/px · 1 of 114 frames shown (6 of 10)]
[frame 58/114]
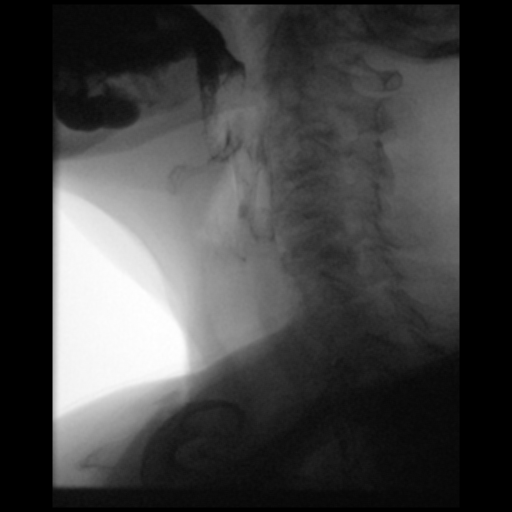

[Series 8: cp_standard · 0.26mm/px · 1 of 1 slices shown (7 of 10)]
[im 1/1]
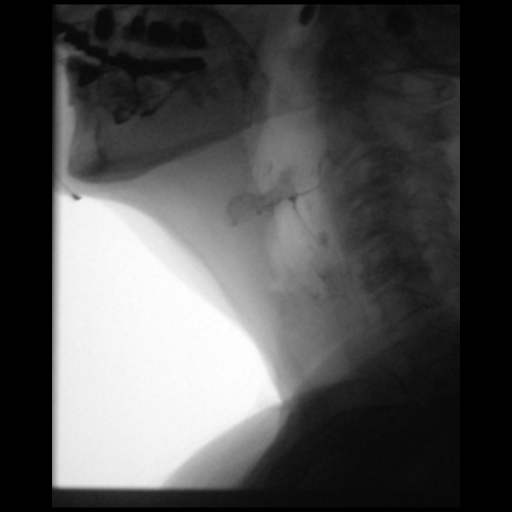

[Series 9: cp_standard · 0.19mm/px · 1 of 60 frames shown (8 of 10)]
[frame 52/60]
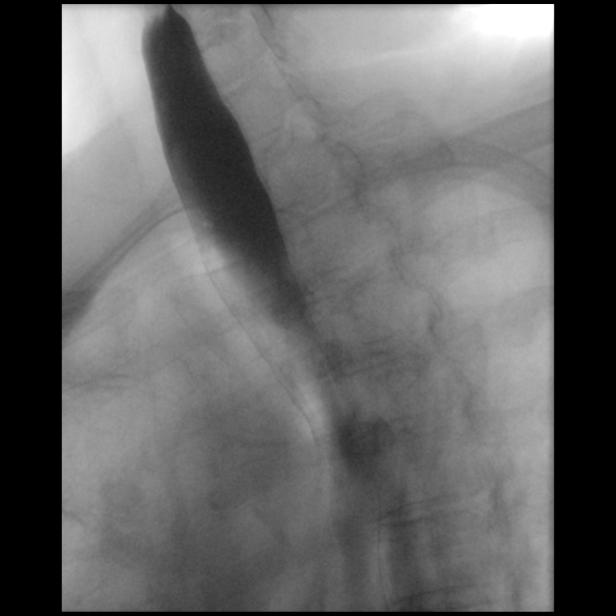

[Series 10: cp_standard · 0.19mm/px · 1 of 34 frames shown (9 of 10)]
[frame 18/34]
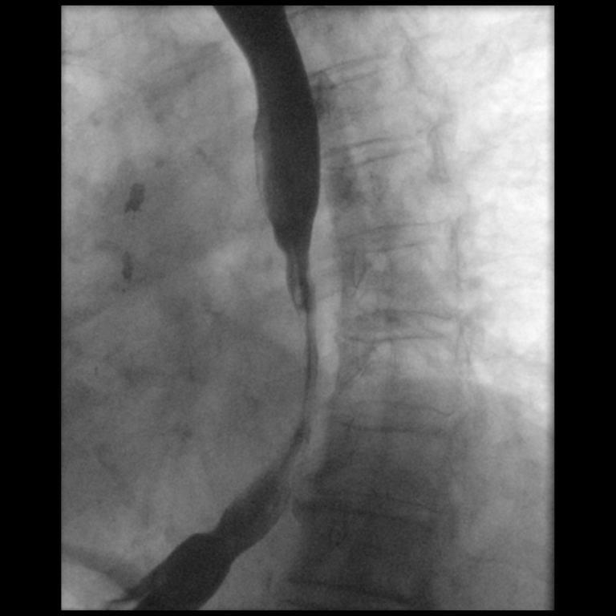

[Series 11: cp_standard · 0.19mm/px · 2 of 64 frames shown (10 of 10)]
[frame 10/64]
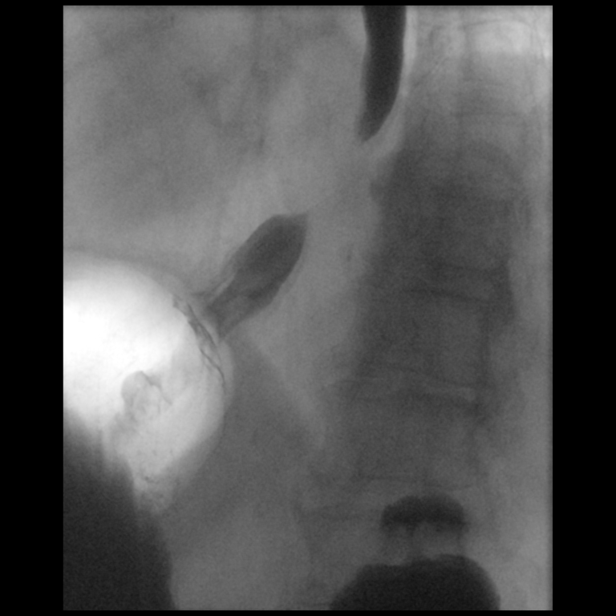
[frame 63/64]
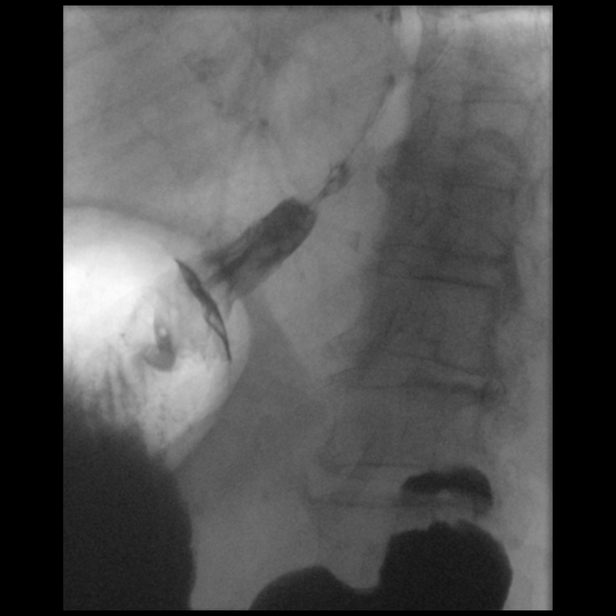

[15 of 24 positions shown; findings below may reference images not displayed]

FINDINGS: Esophageal distention: Normal without mass or stricture

Filling defects:  None

12.5 mm barium tablet: Easily passed from oral cavity to stomach
without delay

Motility:  Age-related esophageal dysmotility, appropriate

Mucosa: Smooth. A single tiny contrast bulges seen at the distal
esophagus, question small ulceration versus forme fruste of a
diverticulum. No other areas of mucosal irregularity

Hypopharynx/cervical esophagus: Spillover of contrast to the
piriform sinuses. No laryngeal penetration or aspiration. Mild
residuals within the piriform sinuses, cleared by a second swallow

Hiatal hernia:  Absent

GE reflux:  Not witnessed during exam

Other:  N/A
IMPRESSION: No evidence of esophageal mass or stricture.

Single tiny erosion versus forme fruste of a diverticulum at the
distal esophagus.

Mild piriform sinus residuals, cleared by a second swallow.

## 2019-05-05 DIAGNOSIS — R413 Other amnesia: Secondary | ICD-10-CM | POA: Diagnosis not present

## 2019-05-06 DIAGNOSIS — Z299 Encounter for prophylactic measures, unspecified: Secondary | ICD-10-CM | POA: Diagnosis not present

## 2019-05-06 DIAGNOSIS — I10 Essential (primary) hypertension: Secondary | ICD-10-CM | POA: Diagnosis not present

## 2019-05-06 DIAGNOSIS — F322 Major depressive disorder, single episode, severe without psychotic features: Secondary | ICD-10-CM | POA: Diagnosis not present

## 2019-05-06 DIAGNOSIS — R0789 Other chest pain: Secondary | ICD-10-CM | POA: Diagnosis not present

## 2019-05-06 DIAGNOSIS — Z6825 Body mass index (BMI) 25.0-25.9, adult: Secondary | ICD-10-CM | POA: Diagnosis not present

## 2019-06-01 DIAGNOSIS — I10 Essential (primary) hypertension: Secondary | ICD-10-CM | POA: Diagnosis not present

## 2019-06-01 DIAGNOSIS — Z6825 Body mass index (BMI) 25.0-25.9, adult: Secondary | ICD-10-CM | POA: Diagnosis not present

## 2019-06-01 DIAGNOSIS — Z299 Encounter for prophylactic measures, unspecified: Secondary | ICD-10-CM | POA: Diagnosis not present

## 2019-06-01 DIAGNOSIS — R413 Other amnesia: Secondary | ICD-10-CM | POA: Diagnosis not present

## 2019-06-01 DIAGNOSIS — F322 Major depressive disorder, single episode, severe without psychotic features: Secondary | ICD-10-CM | POA: Diagnosis not present

## 2019-06-09 ENCOUNTER — Other Ambulatory Visit: Payer: Self-pay | Admitting: Physician Assistant

## 2019-06-09 ENCOUNTER — Other Ambulatory Visit (HOSPITAL_COMMUNITY): Payer: Self-pay | Admitting: Physician Assistant

## 2019-06-09 DIAGNOSIS — M5441 Lumbago with sciatica, right side: Secondary | ICD-10-CM

## 2019-06-20 DIAGNOSIS — Z789 Other specified health status: Secondary | ICD-10-CM | POA: Diagnosis not present

## 2019-06-20 DIAGNOSIS — F322 Major depressive disorder, single episode, severe without psychotic features: Secondary | ICD-10-CM | POA: Diagnosis not present

## 2019-06-20 DIAGNOSIS — I10 Essential (primary) hypertension: Secondary | ICD-10-CM | POA: Diagnosis not present

## 2019-06-20 DIAGNOSIS — Z6825 Body mass index (BMI) 25.0-25.9, adult: Secondary | ICD-10-CM | POA: Diagnosis not present

## 2019-06-20 DIAGNOSIS — Z299 Encounter for prophylactic measures, unspecified: Secondary | ICD-10-CM | POA: Diagnosis not present

## 2019-07-18 DIAGNOSIS — I10 Essential (primary) hypertension: Secondary | ICD-10-CM | POA: Diagnosis not present

## 2019-07-25 DIAGNOSIS — H0102B Squamous blepharitis left eye, upper and lower eyelids: Secondary | ICD-10-CM | POA: Diagnosis not present

## 2019-07-25 DIAGNOSIS — H04123 Dry eye syndrome of bilateral lacrimal glands: Secondary | ICD-10-CM | POA: Diagnosis not present

## 2019-07-25 DIAGNOSIS — H0102A Squamous blepharitis right eye, upper and lower eyelids: Secondary | ICD-10-CM | POA: Diagnosis not present

## 2019-07-25 DIAGNOSIS — H401231 Low-tension glaucoma, bilateral, mild stage: Secondary | ICD-10-CM | POA: Diagnosis not present

## 2019-07-28 DIAGNOSIS — R5383 Other fatigue: Secondary | ICD-10-CM | POA: Diagnosis not present

## 2019-07-28 DIAGNOSIS — E785 Hyperlipidemia, unspecified: Secondary | ICD-10-CM | POA: Diagnosis not present

## 2019-07-28 DIAGNOSIS — I10 Essential (primary) hypertension: Secondary | ICD-10-CM | POA: Diagnosis not present

## 2019-07-28 DIAGNOSIS — Z79899 Other long term (current) drug therapy: Secondary | ICD-10-CM | POA: Diagnosis not present

## 2019-07-28 DIAGNOSIS — Z1211 Encounter for screening for malignant neoplasm of colon: Secondary | ICD-10-CM | POA: Diagnosis not present

## 2019-07-28 DIAGNOSIS — Z7189 Other specified counseling: Secondary | ICD-10-CM | POA: Diagnosis not present

## 2019-07-28 DIAGNOSIS — Z1331 Encounter for screening for depression: Secondary | ICD-10-CM | POA: Diagnosis not present

## 2019-07-28 DIAGNOSIS — Z6823 Body mass index (BMI) 23.0-23.9, adult: Secondary | ICD-10-CM | POA: Diagnosis not present

## 2019-07-28 DIAGNOSIS — Z1339 Encounter for screening examination for other mental health and behavioral disorders: Secondary | ICD-10-CM | POA: Diagnosis not present

## 2019-07-28 DIAGNOSIS — Z299 Encounter for prophylactic measures, unspecified: Secondary | ICD-10-CM | POA: Diagnosis not present

## 2019-07-28 DIAGNOSIS — Z0000 Encounter for general adult medical examination without abnormal findings: Secondary | ICD-10-CM | POA: Diagnosis not present

## 2019-08-19 ENCOUNTER — Ambulatory Visit (HOSPITAL_COMMUNITY): Payer: Medicare HMO

## 2019-08-23 DIAGNOSIS — I10 Essential (primary) hypertension: Secondary | ICD-10-CM | POA: Diagnosis not present

## 2019-08-29 DIAGNOSIS — I10 Essential (primary) hypertension: Secondary | ICD-10-CM | POA: Diagnosis not present

## 2019-08-29 DIAGNOSIS — Z299 Encounter for prophylactic measures, unspecified: Secondary | ICD-10-CM | POA: Diagnosis not present

## 2019-08-29 DIAGNOSIS — R7301 Impaired fasting glucose: Secondary | ICD-10-CM | POA: Diagnosis not present

## 2019-08-29 DIAGNOSIS — G309 Alzheimer's disease, unspecified: Secondary | ICD-10-CM | POA: Diagnosis not present

## 2019-08-29 DIAGNOSIS — R7303 Prediabetes: Secondary | ICD-10-CM | POA: Diagnosis not present

## 2019-09-19 NOTE — Progress Notes (Signed)
Virtual Visit via Video Note   This visit type was conducted due to national recommendations for restrictions regarding the COVID-19 Pandemic (e.g. social distancing) in an effort to limit this patient's exposure and mitigate transmission in our community.  Due to her co-morbid illnesses, this patient is at least at moderate risk for complications without adequate follow up.  This format is felt to be most appropriate for this patient at this time.  All issues noted in this document were discussed and .  A limited physical exam was performed with this format.  Please refer to the patient's chart for her consent to telehealth for [facility]. addressed  The patient was identified using 2 identifiers.  Date:  09/20/2019   ID:  Debra Spencer, DOB [birth year], MRN [id number]  Patient Location: Home Provider Location: Office  PCP:  Glenda Chroman, MD  Cardiologist:  Sanda Klein, MD  Electrophysiologist:  None   Evaluation Performed:  Follow-Up Visit  Chief Complaint:  Routine follow-up  History of Present Illness:    Debra Spencer is a 86 y.o. female with PMH of mild coronary artery disease, HTN, HLD, and atypical chest pain.   She was last evaluated by cardiology during a telemedicine visit with Dr. Sallyanne Kuster 09/03/2018, at which time she recently suffered a fall resulting in a shoulder fracture. She still reported occasional chest discomfort when laying down at night which is relieved with rubbing diclofenac over her chest. No exertional chest pain complaints or other cardiac issues reported at that time. She reported difficulty obtaining valsartan and was given an interim prescription for losartan to hold her over. No other medication changes occurred and she was recommended to follow-up in 1 year. Her last ischemic evaluation was a NST in 2016 which was without ischemia and had an EF of 66%.  She presents today for routine follow-up. She has been doing fine from a cardiac  standpoint. Working with her PCP for BP management. They seem to be somewhat labile based on BP log, ranging from 110s/60s - 200s/80s, though predominately in the 130s/70s. She continues to have chronic chest pain c/w arthritis. No exertional chest pain or SOB. Daughter asks about updating her SL nitro just to have on hand. She has occasional LE edema and props them up throughout the day. Daughter reports that she likes salty snacks and uses salt when cooking. We discussed limiting salt to help with swelling in her legs, as well as improving BP. She has some unsteadiness on her feet but no recent falls and no dizziness, lightheadedness, or syncope. Suggested reaching out to PCP for PT referral if unsteadiness persists or worsens.   The patient does not have symptoms concerning for COVID-19 infection (fever, chills, cough, or new shortness of breath).    Past Medical History:  Diagnosis Date  . Anxiety    hx   . Cerebrovascular disease    nonobstructive. carotid dopplers, July 2008  . Coronary atherosclerosis    mild; treated medically. neg asenosine stress cardiolite, EF 77% 6/08. low risk exercise stress cardiolite 11/06. sugg. of possib. small area  of apical ischemia. minor luminal irreg., 10-20% ostial LAD stenosis at cardiac cath 11/09.   . Diverticulitis   . Drug intolerance    hx of nitrate intolerance   . Dyslipidemia    intolerant to simvastatin  . GERD (gastroesophageal reflux disease)    Past Surgical History:  Procedure Laterality Date  . APPENDECTOMY    . BACK SURGERY  12/2016  L4-L5  . CHOLECYSTECTOMY    . TONSILLECTOMY       Current Meds  Medication Sig  . acetaminophen (TYLENOL) 500 MG tablet Take 1,000 mg by mouth every 6 (six) hours as needed.  Marland Kitchen alendronate (FOSAMAX) 70 MG tablet Take 1 tablet by mouth once a week. Take 1 tab once a week  . Calcium-Vitamin D 600-200 MG-UNIT per tablet Take 1 tablet by mouth daily. 600-200?   Marland Kitchen Cholecalciferol 50 MCG (2000 UT)  CAPS Take 2,000 Units by mouth daily.  . Ginger, Zingiber officinalis, (GINGER ROOT PO) Take daily by mouth.  . latanoprost (XALATAN) 0.005 % ophthalmic solution Place 1 drop into both eyes at bedtime.  Marland Kitchen losartan (COZAAR) 25 MG tablet Take 1 tablet (25 mg total) by mouth daily.  . metoprolol tartrate (LOPRESSOR) 50 MG tablet Take 1 tablet by mouth twice daily  . Multiple Vitamin (MULTIVITAMIN) tablet Take 1 tablet by mouth daily.    . Omega-3 Fatty Acids (FISH OIL PO) Take by mouth 2 (two) times daily.   Marland Kitchen OVER THE COUNTER MEDICATION Take 1 tablet by mouth daily.  . pantoprazole (PROTONIX) 40 MG tablet Take 40 mg by mouth daily.     Allergies:   Amoxicillin, Erythromycin, Nitrofurantoin, and Other   Social History   Tobacco Use  . Smoking status: Never Smoker  . Smokeless tobacco: Never Used  . Tobacco comment: tobacco use - no   Substance Use Topics  . Alcohol use: No    Alcohol/week: 0.0 standard drinks  . Drug use: No     Family Hx: The patient's family history is negative for Colon cancer.  ROS:   Please see the history of present illness.     All other systems reviewed and are negative.   Prior CV studies:   The following studies were reviewed today:  NST 2016:  Nuclear stress EF: 66%.  The left ventricular ejection fraction is hyperdynamic (>65%).  There was no ST segment deviation noted during stress.  The study is normal.   Normal stress nuclear study with a small, mild, predominantly fixed apical defect consistent with apical thinning; no ischemia; EF 65 with normal wall motion.    Labs/Other Tests and Data Reviewed:    EKG:  No ECG reviewed.  Recent Labs: No results found for requested labs within last 8760 hours.   Recent Lipid Panel No results found for: CHOL, TRIG, HDL, CHOLHDL, LDLCALC, LDLDIRECT  Wt Readings from Last 3 Encounters:  09/20/19 142 lb (64.4 kg)  12/30/17 143 lb 9.6 oz (65.1 kg)  09/28/17 142 lb (64.4 kg)     Objective:      Vital Signs:  BP 138/73   Pulse 68   Ht 5\' 4"  (1.626 m)   Wt 142 lb (64.4 kg)   BMI 24.37 kg/m    VITAL SIGNS:  reviewed GEN:  no acute distress EYES:  sclerae anicteric, EOMI - Extraocular Movements Intact RESPIRATORY:  nonlabored breathing, speaking in full sentences without shortness of breath CARDIOVASCULAR:  trace LE edema SKIN:  no rash, lesions or ulcers. MUSCULOSKELETAL:  no obvious deformities. NEURO:  A&O x3 PSYCH:  normal affect  ASSESSMENT & PLAN:    1. Coronary artery disease: mild isolated plaque on LHC in 2009. Negative NST in 2016. No exertional complaints - Continue to monitor symptoms  2. HTN: BP 138/73 today - BP somewhat labile based on log, ranging from 110s/60s - 200s/80s, though predominately in the 130s/70s.  - Continue valsartan and metoprolol  3. HLD: No recent LDL's on file She is intolerant to statins - Continue omega 3  4. Atypical chest pain: c/w MSK pain. - Continue topical anti-inflammatory medication  COVID-19 Education: The signs and symptoms of COVID-19 were discussed with the patient and how to seek care for testing (follow up with PCP or arrange E-visit).  The importance of social distancing was discussed today.  Time:   Today, I have spent 15 minutes with the patient with telehealth technology discussing the above problems.     Medication Adjustments/Labs and Tests Ordered: Current medicines are reviewed at length with the patient today.  Concerns regarding medicines are outlined above.   Tests Ordered: No orders of the defined types were placed in this encounter.   Medication Changes: No orders of the defined types were placed in this encounter.   Follow Up:  Either In Person or Virtual in 6 month(s)  Signed, Abigail Butts, PA-C  09/20/2019 12:02 PM    [place]

## 2019-09-20 ENCOUNTER — Other Ambulatory Visit (HOSPITAL_COMMUNITY): Payer: Medicare HMO

## 2019-09-20 ENCOUNTER — Telehealth (INDEPENDENT_AMBULATORY_CARE_PROVIDER_SITE_OTHER): Payer: Medicare Other | Admitting: Medical

## 2019-09-20 VITALS — BP 138/73 | HR 68 | Ht 64.0 in | Wt 142.0 lb

## 2019-09-20 DIAGNOSIS — I2510 Atherosclerotic heart disease of native coronary artery without angina pectoris: Secondary | ICD-10-CM | POA: Diagnosis not present

## 2019-09-20 DIAGNOSIS — I10 Essential (primary) hypertension: Secondary | ICD-10-CM

## 2019-09-20 DIAGNOSIS — E7800 Pure hypercholesterolemia, unspecified: Secondary | ICD-10-CM

## 2019-09-20 DIAGNOSIS — R0789 Other chest pain: Secondary | ICD-10-CM | POA: Diagnosis not present

## 2019-09-20 MED ORDER — NITROGLYCERIN 0.4 MG SL SUBL: 0.4000 mg | SUBLINGUAL_TABLET | SUBLINGUAL | 2 refills | Status: AC | PRN

## 2019-09-20 NOTE — Progress Notes (Signed)
Thanks, agree with leaving BP meds as is.

## 2019-09-20 NOTE — Patient Instructions (Addendum)

## 2019-09-21 DIAGNOSIS — S0083XA Contusion of other part of head, initial encounter: Secondary | ICD-10-CM | POA: Diagnosis not present

## 2019-09-21 DIAGNOSIS — W010XXA Fall on same level from slipping, tripping and stumbling without subsequent striking against object, initial encounter: Secondary | ICD-10-CM | POA: Diagnosis not present

## 2019-09-21 DIAGNOSIS — S62615A Displaced fracture of proximal phalanx of left ring finger, initial encounter for closed fracture: Secondary | ICD-10-CM | POA: Diagnosis not present

## 2019-09-21 DIAGNOSIS — S199XXA Unspecified injury of neck, initial encounter: Secondary | ICD-10-CM | POA: Diagnosis not present

## 2019-09-21 DIAGNOSIS — S0990XA Unspecified injury of head, initial encounter: Secondary | ICD-10-CM | POA: Diagnosis not present

## 2019-09-21 DIAGNOSIS — S0012XA Contusion of left eyelid and periocular area, initial encounter: Secondary | ICD-10-CM | POA: Diagnosis not present

## 2019-09-23 DIAGNOSIS — I10 Essential (primary) hypertension: Secondary | ICD-10-CM | POA: Diagnosis not present

## 2019-10-07 DIAGNOSIS — Z299 Encounter for prophylactic measures, unspecified: Secondary | ICD-10-CM | POA: Diagnosis not present

## 2019-10-07 DIAGNOSIS — I10 Essential (primary) hypertension: Secondary | ICD-10-CM | POA: Diagnosis not present

## 2019-10-07 DIAGNOSIS — D0439 Carcinoma in situ of skin of other parts of face: Secondary | ICD-10-CM | POA: Diagnosis not present

## 2019-10-13 ENCOUNTER — Encounter (HOSPITAL_COMMUNITY): Payer: Self-pay

## 2019-10-13 ENCOUNTER — Ambulatory Visit (HOSPITAL_COMMUNITY): Payer: Medicare Other

## 2019-10-15 ENCOUNTER — Ambulatory Visit (HOSPITAL_COMMUNITY)
Admission: RE | Admit: 2019-10-15 | Discharge: 2019-10-15 | Disposition: A | Discharge status: Home / Self Care | Payer: Medicare Other | Source: Ambulatory Visit | Attending: Physician Assistant | Admitting: Physician Assistant

## 2019-10-15 ENCOUNTER — Other Ambulatory Visit: Payer: Self-pay

## 2019-10-15 DIAGNOSIS — M5441 Lumbago with sciatica, right side: Secondary | ICD-10-CM | POA: Diagnosis not present

## 2019-10-15 DIAGNOSIS — M545 Low back pain: Secondary | ICD-10-CM | POA: Diagnosis not present

## 2019-10-17 DIAGNOSIS — M545 Low back pain: Secondary | ICD-10-CM | POA: Diagnosis not present

## 2019-10-23 DIAGNOSIS — I10 Essential (primary) hypertension: Secondary | ICD-10-CM | POA: Diagnosis not present

## 2019-10-25 DIAGNOSIS — S62645A Nondisplaced fracture of proximal phalanx of left ring finger, initial encounter for closed fracture: Secondary | ICD-10-CM | POA: Diagnosis not present

## 2019-10-25 DIAGNOSIS — S62515A Nondisplaced fracture of proximal phalanx of left thumb, initial encounter for closed fracture: Secondary | ICD-10-CM | POA: Diagnosis not present

## 2019-11-07 DIAGNOSIS — Z299 Encounter for prophylactic measures, unspecified: Secondary | ICD-10-CM | POA: Diagnosis not present

## 2019-11-07 DIAGNOSIS — I10 Essential (primary) hypertension: Secondary | ICD-10-CM | POA: Diagnosis not present

## 2019-11-07 DIAGNOSIS — C44329 Squamous cell carcinoma of skin of other parts of face: Secondary | ICD-10-CM | POA: Diagnosis not present

## 2019-11-09 DIAGNOSIS — S62515D Nondisplaced fracture of proximal phalanx of left thumb, subsequent encounter for fracture with routine healing: Secondary | ICD-10-CM | POA: Diagnosis not present

## 2019-11-09 DIAGNOSIS — R531 Weakness: Secondary | ICD-10-CM | POA: Diagnosis not present

## 2019-11-09 DIAGNOSIS — M25632 Stiffness of left wrist, not elsewhere classified: Secondary | ICD-10-CM | POA: Diagnosis not present

## 2019-11-09 DIAGNOSIS — M79641 Pain in right hand: Secondary | ICD-10-CM | POA: Diagnosis not present

## 2019-11-09 DIAGNOSIS — M25642 Stiffness of left hand, not elsewhere classified: Secondary | ICD-10-CM | POA: Diagnosis not present

## 2019-11-11 DIAGNOSIS — M79641 Pain in right hand: Secondary | ICD-10-CM | POA: Diagnosis not present

## 2019-11-11 DIAGNOSIS — M25632 Stiffness of left wrist, not elsewhere classified: Secondary | ICD-10-CM | POA: Diagnosis not present

## 2019-11-11 DIAGNOSIS — S62515D Nondisplaced fracture of proximal phalanx of left thumb, subsequent encounter for fracture with routine healing: Secondary | ICD-10-CM | POA: Diagnosis not present

## 2019-11-11 DIAGNOSIS — M25642 Stiffness of left hand, not elsewhere classified: Secondary | ICD-10-CM | POA: Diagnosis not present

## 2019-11-11 DIAGNOSIS — R531 Weakness: Secondary | ICD-10-CM | POA: Diagnosis not present

## 2019-11-14 DIAGNOSIS — M25632 Stiffness of left wrist, not elsewhere classified: Secondary | ICD-10-CM | POA: Diagnosis not present

## 2019-11-14 DIAGNOSIS — M25642 Stiffness of left hand, not elsewhere classified: Secondary | ICD-10-CM | POA: Diagnosis not present

## 2019-11-14 DIAGNOSIS — S62515D Nondisplaced fracture of proximal phalanx of left thumb, subsequent encounter for fracture with routine healing: Secondary | ICD-10-CM | POA: Diagnosis not present

## 2019-11-14 DIAGNOSIS — R531 Weakness: Secondary | ICD-10-CM | POA: Diagnosis not present

## 2019-11-14 DIAGNOSIS — M79641 Pain in right hand: Secondary | ICD-10-CM | POA: Diagnosis not present

## 2019-11-16 DIAGNOSIS — M25632 Stiffness of left wrist, not elsewhere classified: Secondary | ICD-10-CM | POA: Diagnosis not present

## 2019-11-16 DIAGNOSIS — M25642 Stiffness of left hand, not elsewhere classified: Secondary | ICD-10-CM | POA: Diagnosis not present

## 2019-11-16 DIAGNOSIS — M79641 Pain in right hand: Secondary | ICD-10-CM | POA: Diagnosis not present

## 2019-11-16 DIAGNOSIS — S62515D Nondisplaced fracture of proximal phalanx of left thumb, subsequent encounter for fracture with routine healing: Secondary | ICD-10-CM | POA: Diagnosis not present

## 2019-11-16 DIAGNOSIS — R531 Weakness: Secondary | ICD-10-CM | POA: Diagnosis not present

## 2019-11-21 DIAGNOSIS — M25642 Stiffness of left hand, not elsewhere classified: Secondary | ICD-10-CM | POA: Diagnosis not present

## 2019-11-21 DIAGNOSIS — M79641 Pain in right hand: Secondary | ICD-10-CM | POA: Diagnosis not present

## 2019-11-21 DIAGNOSIS — R531 Weakness: Secondary | ICD-10-CM | POA: Diagnosis not present

## 2019-11-21 DIAGNOSIS — M25632 Stiffness of left wrist, not elsewhere classified: Secondary | ICD-10-CM | POA: Diagnosis not present

## 2019-11-21 DIAGNOSIS — S62515D Nondisplaced fracture of proximal phalanx of left thumb, subsequent encounter for fracture with routine healing: Secondary | ICD-10-CM | POA: Diagnosis not present

## 2019-11-23 DIAGNOSIS — S62515D Nondisplaced fracture of proximal phalanx of left thumb, subsequent encounter for fracture with routine healing: Secondary | ICD-10-CM | POA: Diagnosis not present

## 2019-11-23 DIAGNOSIS — I10 Essential (primary) hypertension: Secondary | ICD-10-CM | POA: Diagnosis not present

## 2019-11-25 DIAGNOSIS — R531 Weakness: Secondary | ICD-10-CM | POA: Diagnosis not present

## 2019-11-25 DIAGNOSIS — M25632 Stiffness of left wrist, not elsewhere classified: Secondary | ICD-10-CM | POA: Diagnosis not present

## 2019-11-25 DIAGNOSIS — S62515D Nondisplaced fracture of proximal phalanx of left thumb, subsequent encounter for fracture with routine healing: Secondary | ICD-10-CM | POA: Diagnosis not present

## 2019-11-25 DIAGNOSIS — M25642 Stiffness of left hand, not elsewhere classified: Secondary | ICD-10-CM | POA: Diagnosis not present

## 2019-11-25 DIAGNOSIS — M79641 Pain in right hand: Secondary | ICD-10-CM | POA: Diagnosis not present

## 2019-11-30 DIAGNOSIS — M79641 Pain in right hand: Secondary | ICD-10-CM | POA: Diagnosis not present

## 2019-11-30 DIAGNOSIS — M25642 Stiffness of left hand, not elsewhere classified: Secondary | ICD-10-CM | POA: Diagnosis not present

## 2019-11-30 DIAGNOSIS — S62515D Nondisplaced fracture of proximal phalanx of left thumb, subsequent encounter for fracture with routine healing: Secondary | ICD-10-CM | POA: Diagnosis not present

## 2019-11-30 DIAGNOSIS — R531 Weakness: Secondary | ICD-10-CM | POA: Diagnosis not present

## 2019-11-30 DIAGNOSIS — M25632 Stiffness of left wrist, not elsewhere classified: Secondary | ICD-10-CM | POA: Diagnosis not present

## 2019-12-07 DIAGNOSIS — M79641 Pain in right hand: Secondary | ICD-10-CM | POA: Diagnosis not present

## 2019-12-07 DIAGNOSIS — S62515D Nondisplaced fracture of proximal phalanx of left thumb, subsequent encounter for fracture with routine healing: Secondary | ICD-10-CM | POA: Diagnosis not present

## 2019-12-07 DIAGNOSIS — R531 Weakness: Secondary | ICD-10-CM | POA: Diagnosis not present

## 2019-12-07 DIAGNOSIS — M25632 Stiffness of left wrist, not elsewhere classified: Secondary | ICD-10-CM | POA: Diagnosis not present

## 2019-12-07 DIAGNOSIS — M25642 Stiffness of left hand, not elsewhere classified: Secondary | ICD-10-CM | POA: Diagnosis not present

## 2019-12-14 DIAGNOSIS — M25632 Stiffness of left wrist, not elsewhere classified: Secondary | ICD-10-CM | POA: Diagnosis not present

## 2019-12-14 DIAGNOSIS — R531 Weakness: Secondary | ICD-10-CM | POA: Diagnosis not present

## 2019-12-14 DIAGNOSIS — M25642 Stiffness of left hand, not elsewhere classified: Secondary | ICD-10-CM | POA: Diagnosis not present

## 2019-12-14 DIAGNOSIS — S62515D Nondisplaced fracture of proximal phalanx of left thumb, subsequent encounter for fracture with routine healing: Secondary | ICD-10-CM | POA: Diagnosis not present

## 2019-12-14 DIAGNOSIS — M79641 Pain in right hand: Secondary | ICD-10-CM | POA: Diagnosis not present

## 2019-12-19 DIAGNOSIS — Z1231 Encounter for screening mammogram for malignant neoplasm of breast: Secondary | ICD-10-CM | POA: Diagnosis not present

## 2019-12-21 DIAGNOSIS — M79641 Pain in right hand: Secondary | ICD-10-CM | POA: Diagnosis not present

## 2019-12-21 DIAGNOSIS — M25642 Stiffness of left hand, not elsewhere classified: Secondary | ICD-10-CM | POA: Diagnosis not present

## 2019-12-21 DIAGNOSIS — R531 Weakness: Secondary | ICD-10-CM | POA: Diagnosis not present

## 2019-12-21 DIAGNOSIS — S62515D Nondisplaced fracture of proximal phalanx of left thumb, subsequent encounter for fracture with routine healing: Secondary | ICD-10-CM | POA: Diagnosis not present

## 2019-12-21 DIAGNOSIS — M25632 Stiffness of left wrist, not elsewhere classified: Secondary | ICD-10-CM | POA: Diagnosis not present

## 2019-12-23 DIAGNOSIS — I10 Essential (primary) hypertension: Secondary | ICD-10-CM | POA: Diagnosis not present

## 2019-12-28 DIAGNOSIS — M25632 Stiffness of left wrist, not elsewhere classified: Secondary | ICD-10-CM | POA: Diagnosis not present

## 2019-12-28 DIAGNOSIS — M79641 Pain in right hand: Secondary | ICD-10-CM | POA: Diagnosis not present

## 2019-12-28 DIAGNOSIS — M25642 Stiffness of left hand, not elsewhere classified: Secondary | ICD-10-CM | POA: Diagnosis not present

## 2019-12-28 DIAGNOSIS — S62515D Nondisplaced fracture of proximal phalanx of left thumb, subsequent encounter for fracture with routine healing: Secondary | ICD-10-CM | POA: Diagnosis not present

## 2019-12-28 DIAGNOSIS — R531 Weakness: Secondary | ICD-10-CM | POA: Diagnosis not present

## 2020-01-24 DIAGNOSIS — I10 Essential (primary) hypertension: Secondary | ICD-10-CM | POA: Diagnosis not present

## 2020-01-31 DIAGNOSIS — Z961 Presence of intraocular lens: Secondary | ICD-10-CM | POA: Diagnosis not present

## 2020-01-31 DIAGNOSIS — H04123 Dry eye syndrome of bilateral lacrimal glands: Secondary | ICD-10-CM | POA: Diagnosis not present

## 2020-01-31 DIAGNOSIS — H02032 Senile entropion of right lower eyelid: Secondary | ICD-10-CM | POA: Diagnosis not present

## 2020-01-31 DIAGNOSIS — H401230 Low-tension glaucoma, bilateral, stage unspecified: Secondary | ICD-10-CM | POA: Diagnosis not present

## 2020-02-20 DIAGNOSIS — G5642 Causalgia of left upper limb: Secondary | ICD-10-CM | POA: Diagnosis not present

## 2020-02-20 DIAGNOSIS — M545 Low back pain: Secondary | ICD-10-CM | POA: Diagnosis not present

## 2020-02-20 DIAGNOSIS — S62515D Nondisplaced fracture of proximal phalanx of left thumb, subsequent encounter for fracture with routine healing: Secondary | ICD-10-CM | POA: Diagnosis not present

## 2020-02-23 DIAGNOSIS — I10 Essential (primary) hypertension: Secondary | ICD-10-CM | POA: Diagnosis not present

## 2020-02-29 DIAGNOSIS — Z299 Encounter for prophylactic measures, unspecified: Secondary | ICD-10-CM | POA: Diagnosis not present

## 2020-02-29 DIAGNOSIS — I10 Essential (primary) hypertension: Secondary | ICD-10-CM | POA: Diagnosis not present

## 2020-02-29 DIAGNOSIS — R7303 Prediabetes: Secondary | ICD-10-CM | POA: Diagnosis not present

## 2020-02-29 DIAGNOSIS — G309 Alzheimer's disease, unspecified: Secondary | ICD-10-CM | POA: Diagnosis not present

## 2020-02-29 DIAGNOSIS — R350 Frequency of micturition: Secondary | ICD-10-CM | POA: Diagnosis not present

## 2020-04-04 DIAGNOSIS — M25552 Pain in left hip: Secondary | ICD-10-CM | POA: Diagnosis not present

## 2020-04-30 DIAGNOSIS — G8929 Other chronic pain: Secondary | ICD-10-CM | POA: Diagnosis not present

## 2020-04-30 DIAGNOSIS — M545 Low back pain, unspecified: Secondary | ICD-10-CM | POA: Diagnosis not present

## 2020-04-30 DIAGNOSIS — M25559 Pain in unspecified hip: Secondary | ICD-10-CM | POA: Diagnosis not present

## 2020-05-10 DIAGNOSIS — E785 Hyperlipidemia, unspecified: Secondary | ICD-10-CM | POA: Diagnosis not present

## 2020-05-10 DIAGNOSIS — G4700 Insomnia, unspecified: Secondary | ICD-10-CM | POA: Diagnosis not present

## 2020-05-10 DIAGNOSIS — K529 Noninfective gastroenteritis and colitis, unspecified: Secondary | ICD-10-CM | POA: Diagnosis not present

## 2020-05-10 DIAGNOSIS — Z299 Encounter for prophylactic measures, unspecified: Secondary | ICD-10-CM | POA: Diagnosis not present

## 2020-05-10 DIAGNOSIS — I10 Essential (primary) hypertension: Secondary | ICD-10-CM | POA: Diagnosis not present

## 2020-05-12 DIAGNOSIS — G309 Alzheimer's disease, unspecified: Secondary | ICD-10-CM | POA: Diagnosis not present

## 2020-05-12 DIAGNOSIS — R410 Disorientation, unspecified: Secondary | ICD-10-CM | POA: Diagnosis not present

## 2020-05-12 DIAGNOSIS — R404 Transient alteration of awareness: Secondary | ICD-10-CM | POA: Diagnosis not present

## 2020-05-12 DIAGNOSIS — I10 Essential (primary) hypertension: Secondary | ICD-10-CM | POA: Diagnosis not present

## 2020-05-12 DIAGNOSIS — H9190 Unspecified hearing loss, unspecified ear: Secondary | ICD-10-CM | POA: Diagnosis not present

## 2020-05-12 DIAGNOSIS — Z9049 Acquired absence of other specified parts of digestive tract: Secondary | ICD-10-CM | POA: Diagnosis not present

## 2020-05-12 DIAGNOSIS — Z981 Arthrodesis status: Secondary | ICD-10-CM | POA: Diagnosis not present

## 2020-06-28 ENCOUNTER — Other Ambulatory Visit: Payer: Self-pay

## 2020-06-28 ENCOUNTER — Encounter (INDEPENDENT_AMBULATORY_CARE_PROVIDER_SITE_OTHER): Payer: Self-pay | Admitting: Ophthalmology

## 2020-06-28 ENCOUNTER — Ambulatory Visit (INDEPENDENT_AMBULATORY_CARE_PROVIDER_SITE_OTHER): Payer: Medicare Other | Admitting: Ophthalmology

## 2020-06-28 ENCOUNTER — Encounter (INDEPENDENT_AMBULATORY_CARE_PROVIDER_SITE_OTHER): Payer: Medicare Other | Admitting: Ophthalmology

## 2020-06-28 DIAGNOSIS — H35351 Cystoid macular degeneration, right eye: Secondary | ICD-10-CM | POA: Diagnosis not present

## 2020-06-28 DIAGNOSIS — H353131 Nonexudative age-related macular degeneration, bilateral, early dry stage: Secondary | ICD-10-CM | POA: Diagnosis not present

## 2020-06-28 DIAGNOSIS — H31009 Unspecified chorioretinal scars, unspecified eye: Secondary | ICD-10-CM

## 2020-06-28 DIAGNOSIS — Z961 Presence of intraocular lens: Secondary | ICD-10-CM

## 2020-06-28 DIAGNOSIS — H02032 Senile entropion of right lower eyelid: Secondary | ICD-10-CM

## 2020-06-28 NOTE — Assessment & Plan Note (Signed)
Edition no longer active, will continue to monitor

## 2020-06-28 NOTE — Assessment & Plan Note (Signed)

## 2020-06-28 NOTE — Assessment & Plan Note (Signed)
Patient referred back to Dr. Marshall Cork for diagnosis and disposition and treatment care coordination for spastic entropion or senile entropion right lower lid with foreign body sensation

## 2020-06-28 NOTE — Progress Notes (Signed)
06/28/2020     CHIEF COMPLAINT Patient presents for Retina Follow Up (3 Year f\u OU. OCT/Pt c/o OD being painful and dry. Pt states OD feels gritty. Pt is using preservative free Refresh Plus gtts. )   HISTORY OF PRESENT ILLNESS: Debra Spencer is a 87 y.o. female who presents to the clinic today for:   HPI    Retina Follow Up    Diagnosis: CME.  In right eye.  Severity is moderate.  Duration of 3 years.  Since onset it is stable.  I, the attending physician,  performed the HPI with the patient and updated documentation appropriately. Additional comments: 3 Year f\u OU. OCT Pt c/o OD being painful and dry. Pt states OD feels gritty. Pt is using preservative free Refresh Plus gtts.        Last edited by [name] on 06/28/2020  9:30 AM. (History)      Referring physician: Glenda Chroman, MD [place],  [place] [address]  HISTORICAL INFORMATION:   Selected notes from the MEDICAL RECORD NUMBER       CURRENT MEDICATIONS: Current Outpatient Medications (Ophthalmic Drugs)  Medication Sig  . latanoprost (XALATAN) 0.005 % ophthalmic solution Place 1 drop into both eyes at bedtime.   No current facility-administered medications for this visit. (Ophthalmic Drugs)   Current Outpatient Medications (Other)  Medication Sig  . acetaminophen (TYLENOL) 500 MG tablet Take 1,000 mg by mouth every 6 (six) hours as needed.  Marland Kitchen alendronate (FOSAMAX) 70 MG tablet Take 1 tablet by mouth once a week. Take 1 tab once a week  . Calcium-Vitamin D 600-200 MG-UNIT per tablet Take 1 tablet by mouth daily. 600-200?   Marland Kitchen Cholecalciferol 50 MCG (2000 UT) CAPS Take 2,000 Units by mouth daily.  . citalopram (CELEXA) 20 MG tablet   . Ginger, Zingiber officinalis, (GINGER ROOT PO) Take daily by mouth.  . metoprolol tartrate (LOPRESSOR) 50 MG tablet Take 1 tablet by mouth twice daily  . Multiple Vitamin (MULTIVITAMIN) tablet Take 1 tablet by mouth daily.    . nitroGLYCERIN (NITROSTAT) 0.4 MG SL  tablet Place 1 tablet (0.4 mg total) under the tongue every 5 (five) minutes as needed for chest pain.  . Omega-3 Fatty Acids (FISH OIL PO) Take by mouth 2 (two) times daily.   Marland Kitchen OVER THE COUNTER MEDICATION Take 1 tablet by mouth daily.  . pantoprazole (PROTONIX) 40 MG tablet Take 40 mg by mouth daily.  . valsartan (DIOVAN) 40 MG tablet Take 40 mg by mouth daily. Take 1 tablet daily   No current facility-administered medications for this visit. (Other)      REVIEW OF SYSTEMS:    ALLERGIES Allergies  Allergen Reactions  . Amoxicillin Nausea And Vomiting  . Erythromycin Nausea And Vomiting  . Nitrofurantoin Nausea And Vomiting  . Other     Can't "handle" strong pain medication    PAST MEDICAL HISTORY Past Medical History:  Diagnosis Date  . Anxiety    hx   . Cerebrovascular disease    nonobstructive. carotid dopplers, July 2008  . Coronary atherosclerosis    mild; treated medically. neg asenosine stress cardiolite, EF 77% 6/08. low risk exercise stress cardiolite 11/06. sugg. of possib. small area  of apical ischemia. minor luminal irreg., 10-20% ostial LAD stenosis at cardiac cath 11/09.   . Diverticulitis   . Drug intolerance    hx of nitrate intolerance   . Dyslipidemia    intolerant to simvastatin  .  GERD (gastroesophageal reflux disease)    Past Surgical History:  Procedure Laterality Date  . APPENDECTOMY    . BACK SURGERY  12/2016   L4-L5  . CHOLECYSTECTOMY    . TONSILLECTOMY      FAMILY HISTORY Family History  Problem Relation Age of Onset  . Colon cancer Neg Hx     SOCIAL HISTORY Social History   Tobacco Use  . Smoking status: Never Smoker  . Smokeless tobacco: Never Used  . Tobacco comment: tobacco use - no   Substance Use Topics  . Alcohol use: No    Alcohol/week: 0.0 standard drinks  . Drug use: No         OPHTHALMIC EXAM: Base Eye Exam    Visual Acuity (Snellen - Linear)      Right Left   Dist cc 20/25 -1 20/20   Correction:  Glasses       Tonometry (Tonopen, 9:34 AM)      Right Left   Pressure 12 14       Pupils      Pupils Dark Light Shape React APD   Right PERRL 3 3 Round Minimal None   Left PERRL 3 3 Round Minimal None       Visual Fields (Counting fingers)      Left Right    Full Full       Neuro/Psych    Oriented x3: Yes   Mood/Affect: Normal       Dilation    Both eyes: 1.0% Mydriacyl, 2.5% Phenylephrine @ 9:34 AM        Slit Lamp and Fundus Exam    External Exam      Right Left   External Normal Normal       Slit Lamp Exam      Right Left   Lids/Lashes Entropion LL, 1+ Trichiasis - lower lid Normal   Conjunctiva/Sclera White and quiet White and quiet   Cornea Clear, no abrasions Clear   Anterior Chamber Deep and quiet Deep and quiet   Iris Round and reactive Round and reactive   Lens Centered posterior chamber intraocular lens Centered posterior chamber intraocular lens   Anterior Vitreous Normal Normal       Fundus Exam      Right Left   Posterior Vitreous Vitrectomized Posterior vitreous detachment   Disc Normal Normal   C/D Ratio 0.7 0.65   Macula Hard drusen, Early age related macular degeneration Early age related macular degeneration, Hard drusen   Vessels Normal Normal   Periphery Normal Normal          IMAGING AND PROCEDURES  Imaging and Procedures for 06/28/20  OCT, Retina - OU - Both Eyes       Right Eye Quality was good. Scan locations included subfoveal. Central Foveal Thickness: 350. Progression has been stable. Findings include abnormal foveal contour, retinal drusen .   Left Eye Quality was good. Scan locations included subfoveal. Central Foveal Thickness: 260. Progression has been stable. Findings include normal observations, retinal drusen .   Notes History of vitrectomy for epiretinal membrane right eye, visual acuity much improved nonetheless minor  Early age-related macular degeneration persists OU with no complicating findings.                 ASSESSMENT/PLAN:  Senile entropion of right lower eyelid Patient referred back to Dr. Marshall Cork for diagnosis and disposition and treatment care coordination for spastic entropion or senile entropion right lower lid with foreign body  sensation  Early stage nonexudative age-related macular degeneration of both eyes The nature of age-related macular degeneration was discussed with the patient as well as the distinction between dry and wet types. Checking an Amsler Grid daily with advice to return immediately should a distortion develop, was given to the patient. The patient 's smoking status now and in the past was determined and advice based on the AREDS study was provided regarding the consumption of antioxidant supplements. AREDS 2 vitamin formulation was recommended. Consumption of dark leafy vegetables and fresh fruits of various colors was recommended. Treatment modalities for wet macular degeneration particularly the use of intravitreal injections of anti-blood vessel growth factors was discussed with the patient. Avastin, Lucentis, and Eylea are the available options. On occasion, therapy includes the use of photodynamic therapy and thermal laser. Stressed to the patient do not rub eyes.  Patient was advised to check Amsler Grid daily and return immediately if changes are noted. Instructions on using the grid were given to the patient. All patient questions were answered.  Cystoid macular edema of right eye Edition no longer active, will continue to monitor      ICD-10-CM   1. Chorioretinal scar, unspecified laterality  H31.009 OCT, Retina - OU - Both Eyes  2. Cystoid macular edema of right eye  H35.351 OCT, Retina - OU - Both Eyes  3. Pseudophakia of both eyes  Z96.1   4. Senile entropion of right lower eyelid  H02.032   5. Early stage nonexudative age-related macular degeneration of both eyes  H35.3131     1.  We will monitor for dry age-related macular  degeneration low risk  2.  CME OD has resolved  3.  Spastic entropion accounts for current symptomatology of foreign body sensation right eye  Ophthalmic Meds Ordered this visit:  No orders of the defined types were placed in this encounter.      Return in about 1 year (around 06/28/2021) for DILATE OU, COLOR FP, OCT.  There are no Patient Instructions on file for this visit.   Explained the diagnoses, plan, and follow up with the patient and they expressed understanding.  Patient expressed understanding of the importance of proper follow up care.   Clent Demark Rankin M.D. Diseases & Surgery of the Retina and Vitreous Retina [facility] Diabetic [place] 06/28/20     Abbreviations: M myopia (nearsighted); A astigmatism; H hyperopia (farsighted); P presbyopia; Mrx spectacle prescription;  CTL contact lenses; OD right eye; OS left eye; OU both eyes  XT exotropia; ET esotropia; PEK punctate epithelial keratitis; PEE punctate epithelial erosions; DES dry eye syndrome; MGD meibomian gland dysfunction; ATs artificial tears; PFAT's preservative free artificial tears; [place] nuclear sclerotic cataract; PSC posterior subcapsular cataract; ERM epi-retinal membrane; PVD posterior vitreous detachment; RD retinal detachment; DM diabetes mellitus; DR diabetic retinopathy; NPDR non-proliferative diabetic retinopathy; PDR proliferative diabetic retinopathy; CSME clinically significant macular edema; DME diabetic macular edema; dbh dot blot hemorrhages; CWS cotton wool spot; POAG primary open angle glaucoma; C/D cup-to-disc ratio; HVF humphrey visual field; GVF goldmann visual field; OCT optical coherence tomography; IOP intraocular pressure; BRVO Branch retinal vein occlusion; CRVO central retinal vein occlusion; CRAO central retinal artery occlusion; BRAO branch retinal artery occlusion; RT retinal tear; SB scleral buckle; PPV pars plana vitrectomy; VH Vitreous hemorrhage; PRP panretinal laser photocoagulation; IVK  intravitreal kenalog; VMT vitreomacular traction; MH Macular hole;  NVD neovascularization of the disc; NVE neovascularization elsewhere; AREDS age related eye disease study; ARMD age related macular degeneration; POAG primary  open angle glaucoma; EBMD epithelial/anterior basement membrane dystrophy; ACIOL anterior chamber intraocular lens; IOL intraocular lens; PCIOL posterior chamber intraocular lens; Phaco/IOL phacoemulsification with intraocular lens placement; [place] photorefractive keratectomy; LASIK laser assisted in situ keratomileusis; HTN hypertension; DM diabetes mellitus; COPD chronic obstructive pulmonary disease

## 2020-07-05 DIAGNOSIS — H02032 Senile entropion of right lower eyelid: Secondary | ICD-10-CM | POA: Diagnosis not present

## 2020-07-05 DIAGNOSIS — H401231 Low-tension glaucoma, bilateral, mild stage: Secondary | ICD-10-CM | POA: Diagnosis not present

## 2020-07-19 DIAGNOSIS — H02032 Senile entropion of right lower eyelid: Secondary | ICD-10-CM | POA: Diagnosis not present

## 2020-07-19 DIAGNOSIS — H401230 Low-tension glaucoma, bilateral, stage unspecified: Secondary | ICD-10-CM | POA: Diagnosis not present

## 2020-08-01 DIAGNOSIS — Z79899 Other long term (current) drug therapy: Secondary | ICD-10-CM | POA: Diagnosis not present

## 2020-08-01 DIAGNOSIS — Z299 Encounter for prophylactic measures, unspecified: Secondary | ICD-10-CM | POA: Diagnosis not present

## 2020-08-01 DIAGNOSIS — Z0000 Encounter for general adult medical examination without abnormal findings: Secondary | ICD-10-CM | POA: Diagnosis not present

## 2020-08-01 DIAGNOSIS — I10 Essential (primary) hypertension: Secondary | ICD-10-CM | POA: Diagnosis not present

## 2020-08-01 DIAGNOSIS — E785 Hyperlipidemia, unspecified: Secondary | ICD-10-CM | POA: Diagnosis not present

## 2020-08-01 DIAGNOSIS — R5383 Other fatigue: Secondary | ICD-10-CM | POA: Diagnosis not present

## 2020-08-01 DIAGNOSIS — Z7189 Other specified counseling: Secondary | ICD-10-CM | POA: Diagnosis not present

## 2020-08-01 DIAGNOSIS — Z789 Other specified health status: Secondary | ICD-10-CM | POA: Diagnosis not present

## 2020-08-02 DIAGNOSIS — H16211 Exposure keratoconjunctivitis, right eye: Secondary | ICD-10-CM | POA: Diagnosis not present

## 2020-08-02 DIAGNOSIS — H02532 Eyelid retraction right lower eyelid: Secondary | ICD-10-CM | POA: Diagnosis not present

## 2020-08-02 DIAGNOSIS — H02032 Senile entropion of right lower eyelid: Secondary | ICD-10-CM | POA: Diagnosis not present

## 2020-08-03 ENCOUNTER — Telehealth: Payer: Self-pay | Admitting: *Deleted

## 2020-08-03 ENCOUNTER — Telehealth: Payer: Self-pay

## 2020-08-03 NOTE — Telephone Encounter (Signed)
Patient scheduled for 3/16 with Coletta Memos for pre op clearence

## 2020-08-03 NOTE — Telephone Encounter (Signed)
Called patients daughter  Kaitlan Bin to schedule pre op clearance will forward to scheduling for appointment

## 2020-08-03 NOTE — Telephone Encounter (Signed)
   [facility] Pre-operative Risk Assessment    Primary cardiologist :  Request for surgical clearance:  1. What type of surgery is being performed? Right lower lid entropion repair, right lower lid retraction repair, right permanent tarsorrhaphy  2. When is this surgery scheduled?  08/15/20   3. What type of clearance is required (medical clearance vs. Pharmacy clearance to hold med vs. Both)?  Medical   4. Are there any medications that need to be held prior to surgery and how long?  N/A  5. Practice name and name of physician performing surgery? [facility] : Dr Sarajane Marek  6. What is the office phone number? [phone]   7.   What is the office fax number?  [place]   Anesthesia type (None, local, MAC, general) ? [facility]   Debra Spencer 08/03/2020, 11:19 AM  _________________________________________________________________   (provider comments below)

## 2020-08-03 NOTE — Telephone Encounter (Signed)
Primary Cardiologist:Mihai Croitoru, MD  Chart reviewed as part of pre-operative protocol coverage. Because of Debra Spencer's past medical history and time since last visit, he/she will require a follow-up visit in order to better assess preoperative cardiovascular risk.  Pre-op covering staff: - Please schedule appointment and call patient to inform them. - Please contact requesting surgeon's office via preferred method (i.e, phone, fax) to inform them of need for appointment prior to surgery.  If applicable, this message will also be routed to pharmacy pool and/or primary cardiologist for input on holding anticoagulant/antiplatelet agent as requested below so that this information is available at time of patient's appointment.   Deberah Pelton, NP  08/03/2020, 11:30 AM

## 2020-08-07 ENCOUNTER — Other Ambulatory Visit: Payer: Self-pay

## 2020-08-07 ENCOUNTER — Encounter: Payer: Self-pay | Admitting: Physician Assistant

## 2020-08-07 ENCOUNTER — Ambulatory Visit: Payer: Medicare Other | Admitting: Physician Assistant

## 2020-08-07 VITALS — BP 126/72 | HR 60 | Ht 64.0 in | Wt 151.8 lb

## 2020-08-07 DIAGNOSIS — I10 Essential (primary) hypertension: Secondary | ICD-10-CM | POA: Diagnosis not present

## 2020-08-07 DIAGNOSIS — E785 Hyperlipidemia, unspecified: Secondary | ICD-10-CM

## 2020-08-07 DIAGNOSIS — I2510 Atherosclerotic heart disease of native coronary artery without angina pectoris: Secondary | ICD-10-CM

## 2020-08-07 NOTE — Progress Notes (Signed)
Thank you :)

## 2020-08-07 NOTE — Patient Instructions (Signed)
Medication Instructions:  Your physician recommends that you continue on your current medications as directed. Please refer to the Current Medication list given to you today.  *If you need a refill on your cardiac medications before your next appointment, please call your pharmacy*   Lab Work: None ordered   If you have labs (blood work) drawn today and your tests are completely normal, you will receive your results only by: Marland Kitchen MyChart Message (if you have MyChart) OR . A paper copy in the mail If you have any lab test that is abnormal or we need to change your treatment, we will call you to review the results.   Testing/Procedures: None ordered    Follow-Up: At [facility], you and your health needs are our priority.  As part of our continuing mission to provide you with exceptional heart care, we have created designated Provider Care Teams.  These Care Teams include your primary Cardiologist (physician) and Advanced Practice Providers (APPs -  Physician Assistants and Nurse Practitioners) who all work together to provide you with the care you need, when you need it.  We recommend signing up for the patient portal called "MyChart".  Sign up information is provided on this After Visit Summary.  MyChart is used to connect with patients for Virtual Visits (Telemedicine).  Patients are able to view lab/test results, encounter notes, upcoming appointments, etc.  Non-urgent messages can be sent to your provider as well.   To learn more about what you can do with MyChart, go to [email or web address].    Your next appointment:   12 month(s)  The format for your next appointment:   In Person  Provider:   You may see Sanda Klein, MD or one of the following Advanced Practice Providers on your designated Care Team:    Almyra Deforest, PA-C  Fabian Sharp, [place] or   Roby Lofts, [place]    Other Instructions None

## 2020-08-07 NOTE — Progress Notes (Addendum)
Cardiology Office Note    Date:  08/07/2020   ID:  Debra Spencer, Debra Spencer October 24, 1927, MRN [id number]   PCP:  Glenda Chroman, MD   [place]  Cardiologist:  Sanda Klein, MD  Advanced Practice Provider:  No care team member to display Electrophysiologist:  None   [id number]   Chief Complaint  Patient presents with  . Follow-up    History of Present Illness:  Debra Spencer is a 87 y.o. female with history of mild CAD on cath 03/1999 910 to 20% ostial LAD, low risk NST 2016 without ischemia EF 66%, hypertension, HLD, history of atypical chest pain.  Patient was last seen in our office 09/20/2019.  Patient comes in for f/u accompanied by her daughter. Her son moved in with her recently. Very active cooking, cleaning, flower beds. Has fallen once a year for past 3 yrs. Drove recently and didn't know where she was so no longer drives. Hospitalized at [facility] with UTI in December. Overall doing well.     Past Medical History:  Diagnosis Date  . Anxiety    hx   . Cerebrovascular disease    nonobstructive. carotid dopplers, July 2008  . Coronary atherosclerosis    mild; treated medically. neg asenosine stress cardiolite, EF 77% 6/08. low risk exercise stress cardiolite 11/06. sugg. of possib. small area  of apical ischemia. minor luminal irreg., 10-20% ostial LAD stenosis at cardiac cath 11/09.   . Diverticulitis   . Drug intolerance    hx of nitrate intolerance   . Dyslipidemia    intolerant to simvastatin  . GERD (gastroesophageal reflux disease)     Past Surgical History:  Procedure Laterality Date  . APPENDECTOMY    . BACK SURGERY  12/2016   L4-L5  . CHOLECYSTECTOMY    . TONSILLECTOMY      Current Medications: Current Meds  Medication Sig  . acetaminophen (TYLENOL) 500 MG tablet Take 1,000 mg by mouth every 6 (six) hours as needed.  Marland Kitchen alendronate (FOSAMAX) 70 MG tablet Take 1 tablet by mouth once a week. Take 1 tab once a week  .  Calcium-Vitamin D 600-200 MG-UNIT per tablet Take 1 tablet by mouth daily. 600-200?  Marland Kitchen Cholecalciferol 50 MCG (2000 UT) CAPS Take 2,000 Units by mouth daily.  . citalopram (CELEXA) 20 MG tablet   . latanoprost (XALATAN) 0.005 % ophthalmic solution Place 1 drop into both eyes at bedtime.  . metoprolol tartrate (LOPRESSOR) 50 MG tablet Take 1 tablet by mouth twice daily  . Multiple Vitamin (MULTIVITAMIN) tablet Take 1 tablet by mouth daily.  . Omega-3 Fatty Acids (FISH OIL PO) Take by mouth 2 (two) times daily.   Marland Kitchen OVER THE COUNTER MEDICATION Take 1 tablet by mouth daily. immunity  . pantoprazole (PROTONIX) 40 MG tablet Take 40 mg by mouth daily.  . valsartan (DIOVAN) 40 MG tablet Take 40 mg by mouth daily. Take 1 tablet daily     Allergies:   Amoxicillin, Erythromycin, Nitrofurantoin, and Other   Social History   Socioeconomic History  . Marital status: Widowed    Spouse name: Not on file  . Number of children: Not on file  . Years of education: Not on file  . Highest education level: Not on file  Occupational History  . Not on file  Tobacco Use  . Smoking status: Never Smoker  . Smokeless tobacco: Never Used  . Tobacco comment: tobacco use - no   Substance and Sexual  Activity  . Alcohol use: No    Alcohol/week: 0.0 standard drinks  . Drug use: No  . Sexual activity: Not on file  Other Topics Concern  . Not on file  Social History Narrative   Married, retired.    Social Determinants of Health   Financial Resource Strain: Not on file  Food Insecurity: Not on file  Transportation Needs: Not on file  Physical Activity: Not on file  Stress: Not on file  Social Connections: Not on file     Family History:  The patient's family history is not on file.   ROS:   Please see the history of present illness.    ROS All other systems reviewed and are negative.   PHYSICAL EXAM:   VS:  BP 126/72   Pulse 60   Ht 5\' 4"  (1.626 m)   Wt 151 lb 12.8 oz (68.9 kg)   SpO2 98%    BMI 26.06 kg/m   Physical Exam  GEN: Well nourished, well developed, in no acute distress  Neck: no JVD, carotid bruits, or masses Cardiac:RRR; no murmurs, rubs, or gallops  Respiratory:  clear to auscultation bilaterally, normal work of breathing GI: soft, nontender, nondistended, + BS Ext: without cyanosis, clubbing, or edema, Good distal pulses bilaterally Neuro:  Alert and Oriented x 3 Psych: euthymic mood, full affect  Wt Readings from Last 3 Encounters:  08/07/20 151 lb 12.8 oz (68.9 kg)  09/20/19 142 lb (64.4 kg)  12/30/17 143 lb 9.6 oz (65.1 kg)      Studies/Labs Reviewed:   EKG:  EKG is  ordered today.  The ekg ordered today demonstrates NSR, normal EKG  Recent Labs: No results found for requested labs within last 8760 hours.   Lipid Panel No results found for: CHOL, TRIG, HDL, CHOLHDL, VLDL, LDLCALC, LDLDIRECT  Additional studies/ records that were reviewed today include:  NST 03/2015 Study Highlights   Nuclear stress EF: 66%.  The left ventricular ejection fraction is hyperdynamic (>65%).  There was no ST segment deviation noted during stress.  The study is normal.   Normal stress nuclear study with a small, mild, predominantly fixed apical defect consistent with apical thinning; no ischemia; EF 65 with normal wall motion.      Risk Assessment/Calculations:         ASSESSMENT:    1. Coronary artery disease involving native coronary artery of native heart without angina pectoris   2. Essential hypertension   3. Hyperlipidemia, unspecified hyperlipidemia type      PLAN:  In order of problems listed above:  Preop clearance for eye lid repair at [facility] by Dr. Sarajane Marek. Low risk surgery and patient remains active without cardiac complaints. METS over 4. No further cardiac work up necessary prior to above surgery. According to the Revised Cardiac Risk Index (RCRI), her Perioperative Risk of Major Cardiac Event is (%): 0.9  Her  Functional Capacity in METs is: 5.62 according to the Duke Activity Status Index (DASI).    CAD mild isolated plaque on cath in 2009, negative NST 2016-no angina, remains active for her age. Labs in December in Care everywhere reviewed and stable.  Hypertension well controlled on metoprolol and diovan  Hyperlipidemia intolerant to statins and omega-3  Shared Decision Making/Informed Consent        Medication Adjustments/Labs and Tests Ordered: Current medicines are reviewed at length with the patient today.  Concerns regarding medicines are outlined above.  Medication changes, Labs and Tests ordered today are  listed in the Patient Instructions below. Patient Instructions  Medication Instructions:  Your physician recommends that you continue on your current medications as directed. Please refer to the Current Medication list given to you today.  *If you need a refill on your cardiac medications before your next appointment, please call your pharmacy*   Lab Work: None ordered   If you have labs (blood work) drawn today and your tests are completely normal, you will receive your results only by: Marland Kitchen MyChart Message (if you have MyChart) OR . A paper copy in the mail If you have any lab test that is abnormal or we need to change your treatment, we will call you to review the results.   Testing/Procedures: None ordered    Follow-Up: At [facility], you and your health needs are our priority.  As part of our continuing mission to provide you with exceptional heart care, we have created designated Provider Care Teams.  These Care Teams include your primary Cardiologist (physician) and Advanced Practice Providers (APPs -  Physician Assistants and Nurse Practitioners) who all work together to provide you with the care you need, when you need it.  We recommend signing up for the patient portal called "MyChart".  Sign up information is provided on this After Visit Summary.  MyChart is  used to connect with patients for Virtual Visits (Telemedicine).  Patients are able to view lab/test results, encounter notes, upcoming appointments, etc.  Non-urgent messages can be sent to your provider as well.   To learn more about what you can do with MyChart, go to [email or web address].    Your next appointment:   12 month(s)  The format for your next appointment:   In Person  Provider:   You may see Sanda Klein, MD or one of the following Advanced Practice Providers on your designated Care Team:    Almyra Deforest, PA-C  Fabian Sharp, [place] or   Roby Lofts, PA-C    Other Instructions None      Signed, Ermalinda Barrios, Hershal Coria  08/07/2020 12:30 PM    [facility] [place], [place], [place]  [address] Phone: [phone]; Fax: [phone]

## 2020-08-08 ENCOUNTER — Ambulatory Visit: Payer: Medicare Other | Admitting: General Practice

## 2020-08-08 NOTE — Telephone Encounter (Signed)
   Primary Cardiologist: Sanda Klein, MD  Chart reviewed as part of pre-operative protocol coverage. Preop status addressed at outpatient visit with Ermalinda Barrios, Pa-C 08/07/20.  Debra Spencer would be at acceptable risk for the planned procedure without further cardiovascular testing.   I will route this recommendation and the office note from 08/07/20 to the requesting party via [facility] fax function and remove from pre-op pool.  Please call with questions.  Abigail Butts, PA-C 08/08/2020, 11:52 AM

## 2020-08-08 NOTE — Telephone Encounter (Signed)
Katelyn from Dr. Beacher May office was calling to follow up on the surgical clearance for this patient.

## 2020-08-15 DIAGNOSIS — H16211 Exposure keratoconjunctivitis, right eye: Secondary | ICD-10-CM | POA: Diagnosis not present

## 2020-08-15 DIAGNOSIS — H02532 Eyelid retraction right lower eyelid: Secondary | ICD-10-CM | POA: Diagnosis not present

## 2020-08-15 DIAGNOSIS — H02032 Senile entropion of right lower eyelid: Secondary | ICD-10-CM | POA: Diagnosis not present

## 2020-08-21 DIAGNOSIS — M8468XA Pathological fracture in other disease, other site, initial encounter for fracture: Secondary | ICD-10-CM | POA: Diagnosis not present

## 2020-08-27 DIAGNOSIS — R7303 Prediabetes: Secondary | ICD-10-CM | POA: Diagnosis not present

## 2020-08-27 DIAGNOSIS — Z299 Encounter for prophylactic measures, unspecified: Secondary | ICD-10-CM | POA: Diagnosis not present

## 2020-08-27 DIAGNOSIS — I10 Essential (primary) hypertension: Secondary | ICD-10-CM | POA: Diagnosis not present

## 2020-08-27 DIAGNOSIS — N183 Chronic kidney disease, stage 3 unspecified: Secondary | ICD-10-CM | POA: Diagnosis not present

## 2020-09-05 ENCOUNTER — Encounter (INDEPENDENT_AMBULATORY_CARE_PROVIDER_SITE_OTHER): Payer: Medicare Other | Admitting: Ophthalmology

## 2020-09-26 DIAGNOSIS — Z299 Encounter for prophylactic measures, unspecified: Secondary | ICD-10-CM | POA: Diagnosis not present

## 2020-09-26 DIAGNOSIS — R413 Other amnesia: Secondary | ICD-10-CM | POA: Diagnosis not present

## 2020-09-26 DIAGNOSIS — I10 Essential (primary) hypertension: Secondary | ICD-10-CM | POA: Diagnosis not present

## 2020-09-26 DIAGNOSIS — G309 Alzheimer's disease, unspecified: Secondary | ICD-10-CM | POA: Diagnosis not present

## 2020-10-01 DIAGNOSIS — H401231 Low-tension glaucoma, bilateral, mild stage: Secondary | ICD-10-CM | POA: Diagnosis not present

## 2020-10-01 DIAGNOSIS — H02032 Senile entropion of right lower eyelid: Secondary | ICD-10-CM | POA: Diagnosis not present

## 2020-10-08 DIAGNOSIS — G309 Alzheimer's disease, unspecified: Secondary | ICD-10-CM | POA: Diagnosis not present

## 2020-10-08 DIAGNOSIS — I10 Essential (primary) hypertension: Secondary | ICD-10-CM | POA: Diagnosis not present

## 2020-10-08 DIAGNOSIS — J069 Acute upper respiratory infection, unspecified: Secondary | ICD-10-CM | POA: Diagnosis not present

## 2020-10-08 DIAGNOSIS — Z299 Encounter for prophylactic measures, unspecified: Secondary | ICD-10-CM | POA: Diagnosis not present

## 2020-11-07 DIAGNOSIS — I10 Essential (primary) hypertension: Secondary | ICD-10-CM | POA: Diagnosis not present

## 2020-11-07 DIAGNOSIS — Z299 Encounter for prophylactic measures, unspecified: Secondary | ICD-10-CM | POA: Diagnosis not present

## 2020-11-07 DIAGNOSIS — G309 Alzheimer's disease, unspecified: Secondary | ICD-10-CM | POA: Diagnosis not present

## 2020-11-29 DIAGNOSIS — H02032 Senile entropion of right lower eyelid: Secondary | ICD-10-CM | POA: Diagnosis not present

## 2020-11-29 DIAGNOSIS — Z961 Presence of intraocular lens: Secondary | ICD-10-CM | POA: Diagnosis not present

## 2020-11-29 DIAGNOSIS — H04123 Dry eye syndrome of bilateral lacrimal glands: Secondary | ICD-10-CM | POA: Diagnosis not present

## 2021-02-05 DIAGNOSIS — G309 Alzheimer's disease, unspecified: Secondary | ICD-10-CM | POA: Diagnosis not present

## 2021-02-05 DIAGNOSIS — Z299 Encounter for prophylactic measures, unspecified: Secondary | ICD-10-CM | POA: Diagnosis not present

## 2021-02-05 DIAGNOSIS — I10 Essential (primary) hypertension: Secondary | ICD-10-CM | POA: Diagnosis not present

## 2021-02-05 DIAGNOSIS — Z23 Encounter for immunization: Secondary | ICD-10-CM | POA: Diagnosis not present

## 2021-02-11 DIAGNOSIS — R0789 Other chest pain: Secondary | ICD-10-CM | POA: Diagnosis not present

## 2021-02-11 DIAGNOSIS — N390 Urinary tract infection, site not specified: Secondary | ICD-10-CM | POA: Diagnosis not present

## 2021-02-11 DIAGNOSIS — I10 Essential (primary) hypertension: Secondary | ICD-10-CM | POA: Diagnosis not present

## 2021-02-11 DIAGNOSIS — Z299 Encounter for prophylactic measures, unspecified: Secondary | ICD-10-CM | POA: Diagnosis not present

## 2021-03-26 DIAGNOSIS — H401230 Low-tension glaucoma, bilateral, stage unspecified: Secondary | ICD-10-CM | POA: Diagnosis not present

## 2021-03-26 DIAGNOSIS — H02032 Senile entropion of right lower eyelid: Secondary | ICD-10-CM | POA: Diagnosis not present

## 2021-03-26 DIAGNOSIS — H35371 Puckering of macula, right eye: Secondary | ICD-10-CM | POA: Diagnosis not present

## 2021-03-26 DIAGNOSIS — H04123 Dry eye syndrome of bilateral lacrimal glands: Secondary | ICD-10-CM | POA: Diagnosis not present

## 2021-04-02 DIAGNOSIS — Z299 Encounter for prophylactic measures, unspecified: Secondary | ICD-10-CM | POA: Diagnosis not present

## 2021-04-02 DIAGNOSIS — I700 Atherosclerosis of aorta: Secondary | ICD-10-CM | POA: Diagnosis not present

## 2021-04-02 DIAGNOSIS — D692 Other nonthrombocytopenic purpura: Secondary | ICD-10-CM | POA: Diagnosis not present

## 2021-04-02 DIAGNOSIS — I10 Essential (primary) hypertension: Secondary | ICD-10-CM | POA: Diagnosis not present

## 2021-07-01 DIAGNOSIS — Z789 Other specified health status: Secondary | ICD-10-CM | POA: Diagnosis not present

## 2021-07-01 DIAGNOSIS — G309 Alzheimer's disease, unspecified: Secondary | ICD-10-CM | POA: Diagnosis not present

## 2021-07-01 DIAGNOSIS — I700 Atherosclerosis of aorta: Secondary | ICD-10-CM | POA: Diagnosis not present

## 2021-07-01 DIAGNOSIS — I10 Essential (primary) hypertension: Secondary | ICD-10-CM | POA: Diagnosis not present

## 2021-07-01 DIAGNOSIS — Z299 Encounter for prophylactic measures, unspecified: Secondary | ICD-10-CM | POA: Diagnosis not present

## 2021-07-04 ENCOUNTER — Other Ambulatory Visit: Payer: Self-pay

## 2021-07-04 ENCOUNTER — Encounter (INDEPENDENT_AMBULATORY_CARE_PROVIDER_SITE_OTHER): Payer: Self-pay | Admitting: Ophthalmology

## 2021-07-04 ENCOUNTER — Ambulatory Visit (INDEPENDENT_AMBULATORY_CARE_PROVIDER_SITE_OTHER): Payer: Medicare Other | Admitting: Ophthalmology

## 2021-07-04 DIAGNOSIS — H31009 Unspecified chorioretinal scars, unspecified eye: Secondary | ICD-10-CM | POA: Diagnosis not present

## 2021-07-04 DIAGNOSIS — H02032 Senile entropion of right lower eyelid: Secondary | ICD-10-CM

## 2021-07-04 DIAGNOSIS — H353131 Nonexudative age-related macular degeneration, bilateral, early dry stage: Secondary | ICD-10-CM

## 2021-07-04 DIAGNOSIS — Z9889 Other specified postprocedural states: Secondary | ICD-10-CM | POA: Diagnosis not present

## 2021-07-04 DIAGNOSIS — H35351 Cystoid macular degeneration, right eye: Secondary | ICD-10-CM | POA: Diagnosis not present

## 2021-07-04 NOTE — Assessment & Plan Note (Signed)
Resolved from the past

## 2021-07-04 NOTE — Assessment & Plan Note (Signed)
Follow-up Dr. Van 

## 2021-07-04 NOTE — Progress Notes (Signed)
07/04/2021     CHIEF COMPLAINT Patient presents for  Chief Complaint  Patient presents with   Retina Follow Up      HISTORY OF PRESENT ILLNESS: Debra Spencer is a 88 y.o. female who presents to the clinic today for:   HPI     Retina Follow Up           Diagnosis: Other   Onset: 1 year ago   Severity: mild   Duration: 1 year         Comments   1 yr fu OU oct fp. Patient states vision is stable and unchanged since last visit. Denies any new floaters or FOL. Pt uses Latanoprost OU QHS.      Last edited by Laurin Coder on 07/04/2021 10:01 AM.      Referring physician: Lisabeth Pick, MD [address] [place],  [place] [address]  HISTORICAL INFORMATION:   Selected notes from the MEDICAL RECORD NUMBER       CURRENT MEDICATIONS: Current Outpatient Medications (Ophthalmic Drugs)  Medication Sig   latanoprost (XALATAN) 0.005 % ophthalmic solution Place 1 drop into both eyes at bedtime.   No current facility-administered medications for this visit. (Ophthalmic Drugs)   Current Outpatient Medications (Other)  Medication Sig   acetaminophen (TYLENOL) 500 MG tablet Take 1,000 mg by mouth every 6 (six) hours as needed.   alendronate (FOSAMAX) 70 MG tablet Take 1 tablet by mouth once a week. Take 1 tab once a week   Calcium-Vitamin D 600-200 MG-UNIT per tablet Take 1 tablet by mouth daily. 600-200?   Cholecalciferol 50 MCG (2000 UT) CAPS Take 2,000 Units by mouth daily.   citalopram (CELEXA) 20 MG tablet    metoprolol tartrate (LOPRESSOR) 50 MG tablet Take 1 tablet by mouth twice daily   Multiple Vitamin (MULTIVITAMIN) tablet Take 1 tablet by mouth daily.   nitroGLYCERIN (NITROSTAT) 0.4 MG SL tablet Place 1 tablet (0.4 mg total) under the tongue every 5 (five) minutes as needed for chest pain.   Omega-3 Fatty Acids (FISH OIL PO) Take by mouth 2 (two) times daily.    OVER THE COUNTER MEDICATION Take 1 tablet by mouth daily. immunity   pantoprazole  (PROTONIX) 40 MG tablet Take 40 mg by mouth daily.   valsartan (DIOVAN) 40 MG tablet Take 40 mg by mouth daily. Take 1 tablet daily   No current facility-administered medications for this visit. (Other)      REVIEW OF SYSTEMS:    ALLERGIES Allergies  Allergen Reactions   Amoxicillin Nausea And Vomiting   Erythromycin Nausea And Vomiting   Nitrofurantoin Nausea And Vomiting   Other     Can't "handle" strong pain medication    PAST MEDICAL HISTORY Past Medical History:  Diagnosis Date   Anxiety    hx    Cerebrovascular disease    nonobstructive. carotid dopplers, July 2008   Coronary atherosclerosis    mild; treated medically. neg asenosine stress cardiolite, EF 77% 6/08. low risk exercise stress cardiolite 11/06. sugg. of possib. small area  of apical ischemia. minor luminal irreg., 10-20% ostial LAD stenosis at cardiac cath 11/09.    Diverticulitis    Drug intolerance    hx of nitrate intolerance    Dyslipidemia    intolerant to simvastatin   GERD (gastroesophageal reflux disease)    Past Surgical History:  Procedure Laterality Date   APPENDECTOMY     BACK SURGERY  12/2016   L4-L5   CHOLECYSTECTOMY  TONSILLECTOMY      FAMILY HISTORY Family History  Problem Relation Age of Onset   Colon cancer Neg Hx     SOCIAL HISTORY Social History   Tobacco Use   Smoking status: Never   Smokeless tobacco: Never   Tobacco comments:    tobacco use - no   Substance Use Topics   Alcohol use: No    Alcohol/week: 0.0 standard drinks   Drug use: No         OPHTHALMIC EXAM:  Base Eye Exam     Visual Acuity (ETDRS)       Right Left   Dist cc 20/25 -2 20/25 -2    Correction: Glasses         Tonometry (Tonopen, 10:03 AM)       Right Left   Pressure 14 14         Pupils       Pupils Dark Light React APD   Right PERRL 3 3 Minimal None   Left PERRL 2 2 Minimal None         Visual Fields (Counting fingers)       Left Right    Full Full          Extraocular Movement       Right Left    Full Full         Neuro/Psych     Oriented x3: Yes   Mood/Affect: Normal         Dilation     Both eyes: 1.0% Mydriacyl, 2.5% Phenylephrine @ 10:03 AM           Slit Lamp and Fundus Exam     External Exam       Right Left   External Normal Normal         Slit Lamp Exam       Right Left   Lids/Lashes Entropion LL, 1+ Trichiasis - lower lid Normal   Conjunctiva/Sclera White and quiet White and quiet   Cornea Clear, no abrasions Clear   Anterior Chamber Deep and quiet Deep and quiet   Iris Round and reactive Round and reactive   Lens Centered posterior chamber intraocular lens Centered posterior chamber intraocular lens   Anterior Vitreous Normal Normal         Fundus Exam       Right Left   Posterior Vitreous Vitrectomized Posterior vitreous detachment   Disc Normal Normal   C/D Ratio 0.7 0.65   Macula Hard drusen, Early age related macular degeneration Early age related macular degeneration, Hard drusen   Vessels Normal Normal   Periphery Normal Normal            IMAGING AND PROCEDURES  Imaging and Procedures for 07/04/21  OCT, Retina - OU - Both Eyes       Right Eye Quality was good. Scan locations included subfoveal. Central Foveal Thickness: 344. Progression has been stable. Findings include abnormal foveal contour, retinal drusen .   Left Eye Quality was good. Scan locations included subfoveal. Central Foveal Thickness: 257. Progression has been stable. Findings include normal observations, retinal drusen .   Notes History of vitrectomy for epiretinal membrane right eye, visual acuity much improved nonetheless minor  Early age-related macular degeneration persists OU with no complicating findings.     Color Fundus Photography Optos - OU - Both Eyes       Right Eye Progression has no prior data. Disc findings include normal observations. Macula : drusen.  Vessels : normal  observations. Periphery : normal observations.   Left Eye Progression has no prior data. Disc findings include normal observations. Macula : drusen. Vessels : normal observations. Periphery : normal observations.   Notes OD, irregular thickening, from residual of prior epiretinal membrane removed originally 2008 with good acuity             ASSESSMENT/PLAN:  History of vitrectomy Stable macular condition OD  Early stage nonexudative age-related macular degeneration of both eyes Minor OU no active disease  Cystoid macular edema of right eye Resolved from the past  Senile entropion of right lower eyelid Follow-up Dr. Lucianne Lei     ICD-10-CM   1. Chorioretinal scar, unspecified laterality  H31.009 OCT, Retina - OU - Both Eyes    Color Fundus Photography Optos - OU - Both Eyes    2. History of vitrectomy  Z98.890     3. Early stage nonexudative age-related macular degeneration of both eyes  H35.3131     4. Cystoid macular edema of right eye  H35.351     5. Senile entropion of right lower eyelid  H02.032       1.  Mild age-related macular degeneration OU, with no signs of complications  2.  History of vitrectomy OD for epiretinal membrane, repair 2008 looks great  3.  Senile ectropion documented here within the last 2 years, improved under the direction and care of Dr. Leonard Schwartz with nice resolution by examination today  4.  Follow-up with Dr. Lucianne Lei of [facility] eye care as scheduled  Ophthalmic Meds Ordered this visit:  No orders of the defined types were placed in this encounter.      Return in about 1 year (around 07/04/2022) for DILATE OU, COLOR FP, OCT.  There are no Patient Instructions on file for this visit.   Explained the diagnoses, plan, and follow up with the patient and they expressed understanding.  Patient expressed understanding of the importance of proper follow up care.   Clent Demark Felicitas Sine M.D. Diseases & Surgery of the Retina and Vitreous Retina &  Diabetic [place] 07/04/21     Abbreviations: M myopia (nearsighted); A astigmatism; H hyperopia (farsighted); P presbyopia; Mrx spectacle prescription;  CTL contact lenses; OD right eye; OS left eye; OU both eyes  XT exotropia; ET esotropia; PEK punctate epithelial keratitis; PEE punctate epithelial erosions; DES dry eye syndrome; MGD meibomian gland dysfunction; ATs artificial tears; PFAT's preservative free artificial tears; [place] nuclear sclerotic cataract; PSC posterior subcapsular cataract; ERM epi-retinal membrane; PVD posterior vitreous detachment; RD retinal detachment; DM diabetes mellitus; DR diabetic retinopathy; NPDR non-proliferative diabetic retinopathy; PDR proliferative diabetic retinopathy; CSME clinically significant macular edema; DME diabetic macular edema; dbh dot blot hemorrhages; CWS cotton wool spot; POAG primary open angle glaucoma; C/D cup-to-disc ratio; HVF humphrey visual field; GVF goldmann visual field; OCT optical coherence tomography; IOP intraocular pressure; BRVO Branch retinal vein occlusion; CRVO central retinal vein occlusion; CRAO central retinal artery occlusion; BRAO branch retinal artery occlusion; RT retinal tear; SB scleral buckle; PPV pars plana vitrectomy; VH Vitreous hemorrhage; PRP panretinal laser photocoagulation; IVK intravitreal kenalog; VMT vitreomacular traction; MH Macular hole;  NVD neovascularization of the disc; NVE neovascularization elsewhere; AREDS age related eye disease study; ARMD age related macular degeneration; POAG primary open angle glaucoma; EBMD epithelial/anterior basement membrane dystrophy; ACIOL anterior chamber intraocular lens; IOL intraocular lens; PCIOL posterior chamber intraocular lens; Phaco/IOL phacoemulsification with intraocular lens placement; PRK photorefractive keratectomy; LASIK laser assisted in situ keratomileusis; HTN hypertension; DM  diabetes mellitus; COPD chronic obstructive pulmonary disease

## 2021-07-04 NOTE — Assessment & Plan Note (Signed)
Minor OU no active disease

## 2021-07-04 NOTE — Assessment & Plan Note (Signed)
Stable macular condition OD

## 2021-08-01 DIAGNOSIS — Z743 Need for continuous supervision: Secondary | ICD-10-CM | POA: Diagnosis not present

## 2021-08-01 DIAGNOSIS — R5381 Other malaise: Secondary | ICD-10-CM | POA: Diagnosis not present

## 2021-08-01 DIAGNOSIS — W19XXXA Unspecified fall, initial encounter: Secondary | ICD-10-CM | POA: Diagnosis not present

## 2021-08-01 DIAGNOSIS — R519 Headache, unspecified: Secondary | ICD-10-CM | POA: Diagnosis not present

## 2021-08-01 DIAGNOSIS — M542 Cervicalgia: Secondary | ICD-10-CM | POA: Diagnosis not present

## 2021-08-01 DIAGNOSIS — S0031XA Abrasion of nose, initial encounter: Secondary | ICD-10-CM | POA: Diagnosis not present

## 2021-08-01 DIAGNOSIS — W1839XA Other fall on same level, initial encounter: Secondary | ICD-10-CM | POA: Diagnosis not present

## 2021-08-01 DIAGNOSIS — M7989 Other specified soft tissue disorders: Secondary | ICD-10-CM | POA: Diagnosis not present

## 2021-08-01 DIAGNOSIS — S022XXA Fracture of nasal bones, initial encounter for closed fracture: Secondary | ICD-10-CM | POA: Diagnosis not present

## 2021-08-01 DIAGNOSIS — Z23 Encounter for immunization: Secondary | ICD-10-CM | POA: Diagnosis not present

## 2021-08-01 DIAGNOSIS — I10 Essential (primary) hypertension: Secondary | ICD-10-CM | POA: Diagnosis not present

## 2021-08-01 DIAGNOSIS — T148XXA Other injury of unspecified body region, initial encounter: Secondary | ICD-10-CM | POA: Diagnosis not present

## 2021-08-02 DIAGNOSIS — G309 Alzheimer's disease, unspecified: Secondary | ICD-10-CM | POA: Diagnosis not present

## 2021-08-02 DIAGNOSIS — Z299 Encounter for prophylactic measures, unspecified: Secondary | ICD-10-CM | POA: Diagnosis not present

## 2021-08-02 DIAGNOSIS — Z79899 Other long term (current) drug therapy: Secondary | ICD-10-CM | POA: Diagnosis not present

## 2021-08-02 DIAGNOSIS — R5383 Other fatigue: Secondary | ICD-10-CM | POA: Diagnosis not present

## 2021-08-02 DIAGNOSIS — I10 Essential (primary) hypertension: Secondary | ICD-10-CM | POA: Diagnosis not present

## 2021-08-02 DIAGNOSIS — S022XXA Fracture of nasal bones, initial encounter for closed fracture: Secondary | ICD-10-CM | POA: Diagnosis not present

## 2021-08-06 DIAGNOSIS — R040 Epistaxis: Secondary | ICD-10-CM | POA: Diagnosis not present

## 2021-08-06 DIAGNOSIS — S022XXA Fracture of nasal bones, initial encounter for closed fracture: Secondary | ICD-10-CM | POA: Diagnosis not present

## 2021-08-06 DIAGNOSIS — J342 Deviated nasal septum: Secondary | ICD-10-CM | POA: Diagnosis not present

## 2021-08-08 DIAGNOSIS — Z7189 Other specified counseling: Secondary | ICD-10-CM | POA: Diagnosis not present

## 2021-08-08 DIAGNOSIS — I10 Essential (primary) hypertension: Secondary | ICD-10-CM | POA: Diagnosis not present

## 2021-08-08 DIAGNOSIS — Z0000 Encounter for general adult medical examination without abnormal findings: Secondary | ICD-10-CM | POA: Diagnosis not present

## 2021-08-08 DIAGNOSIS — Z789 Other specified health status: Secondary | ICD-10-CM | POA: Diagnosis not present

## 2021-08-08 DIAGNOSIS — E785 Hyperlipidemia, unspecified: Secondary | ICD-10-CM | POA: Diagnosis not present

## 2021-08-08 DIAGNOSIS — Z299 Encounter for prophylactic measures, unspecified: Secondary | ICD-10-CM | POA: Diagnosis not present

## 2021-08-08 DIAGNOSIS — I700 Atherosclerosis of aorta: Secondary | ICD-10-CM | POA: Diagnosis not present

## 2021-09-27 DIAGNOSIS — H04123 Dry eye syndrome of bilateral lacrimal glands: Secondary | ICD-10-CM | POA: Diagnosis not present

## 2021-09-27 DIAGNOSIS — H401230 Low-tension glaucoma, bilateral, stage unspecified: Secondary | ICD-10-CM | POA: Diagnosis not present

## 2021-09-27 DIAGNOSIS — H02032 Senile entropion of right lower eyelid: Secondary | ICD-10-CM | POA: Diagnosis not present

## 2021-12-02 DIAGNOSIS — Z79899 Other long term (current) drug therapy: Secondary | ICD-10-CM | POA: Diagnosis not present

## 2021-12-02 DIAGNOSIS — Z299 Encounter for prophylactic measures, unspecified: Secondary | ICD-10-CM | POA: Diagnosis not present

## 2021-12-02 DIAGNOSIS — R5383 Other fatigue: Secondary | ICD-10-CM | POA: Diagnosis not present

## 2021-12-02 DIAGNOSIS — I10 Essential (primary) hypertension: Secondary | ICD-10-CM | POA: Diagnosis not present

## 2021-12-02 DIAGNOSIS — Z0000 Encounter for general adult medical examination without abnormal findings: Secondary | ICD-10-CM | POA: Diagnosis not present

## 2021-12-02 DIAGNOSIS — E7800 Pure hypercholesterolemia, unspecified: Secondary | ICD-10-CM | POA: Diagnosis not present

## 2022-03-25 DIAGNOSIS — E785 Hyperlipidemia, unspecified: Secondary | ICD-10-CM | POA: Diagnosis not present

## 2022-03-25 DIAGNOSIS — I10 Essential (primary) hypertension: Secondary | ICD-10-CM | POA: Diagnosis not present

## 2022-04-10 DIAGNOSIS — Z299 Encounter for prophylactic measures, unspecified: Secondary | ICD-10-CM | POA: Diagnosis not present

## 2022-04-10 DIAGNOSIS — R42 Dizziness and giddiness: Secondary | ICD-10-CM | POA: Diagnosis not present

## 2022-04-10 DIAGNOSIS — I10 Essential (primary) hypertension: Secondary | ICD-10-CM | POA: Diagnosis not present

## 2022-05-02 DIAGNOSIS — U071 COVID-19: Secondary | ICD-10-CM | POA: Diagnosis not present

## 2022-05-02 DIAGNOSIS — R0981 Nasal congestion: Secondary | ICD-10-CM | POA: Diagnosis not present

## 2022-07-01 DIAGNOSIS — H401231 Low-tension glaucoma, bilateral, mild stage: Secondary | ICD-10-CM | POA: Diagnosis not present

## 2022-07-10 ENCOUNTER — Encounter (INDEPENDENT_AMBULATORY_CARE_PROVIDER_SITE_OTHER): Payer: Medicare Other | Admitting: Ophthalmology

## 2022-08-06 DIAGNOSIS — H401231 Low-tension glaucoma, bilateral, mild stage: Secondary | ICD-10-CM | POA: Diagnosis not present

## 2022-08-26 DIAGNOSIS — R300 Dysuria: Secondary | ICD-10-CM | POA: Diagnosis not present

## 2022-08-26 DIAGNOSIS — Z0000 Encounter for general adult medical examination without abnormal findings: Secondary | ICD-10-CM | POA: Diagnosis not present

## 2022-08-26 DIAGNOSIS — I10 Essential (primary) hypertension: Secondary | ICD-10-CM | POA: Diagnosis not present

## 2022-08-26 DIAGNOSIS — Z7189 Other specified counseling: Secondary | ICD-10-CM | POA: Diagnosis not present

## 2022-08-26 DIAGNOSIS — N390 Urinary tract infection, site not specified: Secondary | ICD-10-CM | POA: Diagnosis not present

## 2022-08-26 DIAGNOSIS — Z299 Encounter for prophylactic measures, unspecified: Secondary | ICD-10-CM | POA: Diagnosis not present

## 2022-09-23 DIAGNOSIS — G309 Alzheimer's disease, unspecified: Secondary | ICD-10-CM | POA: Diagnosis not present

## 2022-09-23 DIAGNOSIS — J309 Allergic rhinitis, unspecified: Secondary | ICD-10-CM | POA: Diagnosis not present

## 2022-09-23 DIAGNOSIS — N183 Chronic kidney disease, stage 3 unspecified: Secondary | ICD-10-CM | POA: Diagnosis not present

## 2022-09-23 DIAGNOSIS — Z299 Encounter for prophylactic measures, unspecified: Secondary | ICD-10-CM | POA: Diagnosis not present

## 2022-11-10 DIAGNOSIS — M79674 Pain in right toe(s): Secondary | ICD-10-CM | POA: Diagnosis not present

## 2022-11-10 DIAGNOSIS — M79672 Pain in left foot: Secondary | ICD-10-CM | POA: Diagnosis not present

## 2022-11-10 DIAGNOSIS — M79675 Pain in left toe(s): Secondary | ICD-10-CM | POA: Diagnosis not present

## 2022-11-10 DIAGNOSIS — M79671 Pain in right foot: Secondary | ICD-10-CM | POA: Diagnosis not present

## 2022-11-10 DIAGNOSIS — L110 Acquired keratosis follicularis: Secondary | ICD-10-CM | POA: Diagnosis not present

## 2022-11-10 DIAGNOSIS — I739 Peripheral vascular disease, unspecified: Secondary | ICD-10-CM | POA: Diagnosis not present

## 2022-12-09 DIAGNOSIS — Z299 Encounter for prophylactic measures, unspecified: Secondary | ICD-10-CM | POA: Diagnosis not present

## 2022-12-09 DIAGNOSIS — I10 Essential (primary) hypertension: Secondary | ICD-10-CM | POA: Diagnosis not present

## 2022-12-09 DIAGNOSIS — E7800 Pure hypercholesterolemia, unspecified: Secondary | ICD-10-CM | POA: Diagnosis not present

## 2022-12-09 DIAGNOSIS — Z0000 Encounter for general adult medical examination without abnormal findings: Secondary | ICD-10-CM | POA: Diagnosis not present

## 2022-12-09 DIAGNOSIS — Z79899 Other long term (current) drug therapy: Secondary | ICD-10-CM | POA: Diagnosis not present

## 2022-12-09 DIAGNOSIS — R5383 Other fatigue: Secondary | ICD-10-CM | POA: Diagnosis not present

## 2022-12-10 DIAGNOSIS — Z79899 Other long term (current) drug therapy: Secondary | ICD-10-CM | POA: Diagnosis not present

## 2022-12-10 DIAGNOSIS — E7800 Pure hypercholesterolemia, unspecified: Secondary | ICD-10-CM | POA: Diagnosis not present

## 2022-12-10 DIAGNOSIS — R5383 Other fatigue: Secondary | ICD-10-CM | POA: Diagnosis not present

## 2023-01-16 DIAGNOSIS — H35371 Puckering of macula, right eye: Secondary | ICD-10-CM | POA: Diagnosis not present

## 2023-01-16 DIAGNOSIS — H353131 Nonexudative age-related macular degeneration, bilateral, early dry stage: Secondary | ICD-10-CM | POA: Diagnosis not present

## 2023-01-16 DIAGNOSIS — H524 Presbyopia: Secondary | ICD-10-CM | POA: Diagnosis not present

## 2023-01-16 DIAGNOSIS — H401231 Low-tension glaucoma, bilateral, mild stage: Secondary | ICD-10-CM | POA: Diagnosis not present

## 2023-01-16 DIAGNOSIS — H04123 Dry eye syndrome of bilateral lacrimal glands: Secondary | ICD-10-CM | POA: Diagnosis not present

## 2023-01-16 DIAGNOSIS — H35033 Hypertensive retinopathy, bilateral: Secondary | ICD-10-CM | POA: Diagnosis not present

## 2023-01-20 DIAGNOSIS — I739 Peripheral vascular disease, unspecified: Secondary | ICD-10-CM | POA: Diagnosis not present

## 2023-01-20 DIAGNOSIS — M79674 Pain in right toe(s): Secondary | ICD-10-CM | POA: Diagnosis not present

## 2023-01-20 DIAGNOSIS — L110 Acquired keratosis follicularis: Secondary | ICD-10-CM | POA: Diagnosis not present

## 2023-01-20 DIAGNOSIS — M79675 Pain in left toe(s): Secondary | ICD-10-CM | POA: Diagnosis not present

## 2023-01-20 DIAGNOSIS — M79672 Pain in left foot: Secondary | ICD-10-CM | POA: Diagnosis not present

## 2023-01-20 DIAGNOSIS — M79671 Pain in right foot: Secondary | ICD-10-CM | POA: Diagnosis not present

## 2023-03-02 DIAGNOSIS — I10 Essential (primary) hypertension: Secondary | ICD-10-CM | POA: Diagnosis not present

## 2023-03-02 DIAGNOSIS — Z299 Encounter for prophylactic measures, unspecified: Secondary | ICD-10-CM | POA: Diagnosis not present

## 2023-03-02 DIAGNOSIS — M25561 Pain in right knee: Secondary | ICD-10-CM | POA: Diagnosis not present

## 2023-03-13 DIAGNOSIS — I10 Essential (primary) hypertension: Secondary | ICD-10-CM | POA: Diagnosis not present

## 2023-03-13 DIAGNOSIS — M25561 Pain in right knee: Secondary | ICD-10-CM | POA: Diagnosis not present

## 2023-03-13 DIAGNOSIS — Z299 Encounter for prophylactic measures, unspecified: Secondary | ICD-10-CM | POA: Diagnosis not present

## 2023-03-13 DIAGNOSIS — M25471 Effusion, right ankle: Secondary | ICD-10-CM | POA: Diagnosis not present

## 2023-03-26 DIAGNOSIS — Z0489 Encounter for examination and observation for other specified reasons: Secondary | ICD-10-CM | POA: Diagnosis not present

## 2023-03-26 DIAGNOSIS — Z79899 Other long term (current) drug therapy: Secondary | ICD-10-CM | POA: Diagnosis not present

## 2023-03-26 DIAGNOSIS — Z7982 Long term (current) use of aspirin: Secondary | ICD-10-CM | POA: Diagnosis not present

## 2023-03-26 DIAGNOSIS — N3000 Acute cystitis without hematuria: Secondary | ICD-10-CM | POA: Diagnosis not present

## 2023-03-26 DIAGNOSIS — Z043 Encounter for examination and observation following other accident: Secondary | ICD-10-CM | POA: Diagnosis not present

## 2023-03-26 DIAGNOSIS — W19XXXA Unspecified fall, initial encounter: Secondary | ICD-10-CM | POA: Diagnosis not present

## 2023-03-26 DIAGNOSIS — R6889 Other general symptoms and signs: Secondary | ICD-10-CM | POA: Diagnosis not present

## 2023-03-26 DIAGNOSIS — I10 Essential (primary) hypertension: Secondary | ICD-10-CM | POA: Diagnosis not present

## 2023-03-26 DIAGNOSIS — Z7983 Long term (current) use of bisphosphonates: Secondary | ICD-10-CM | POA: Diagnosis not present

## 2023-03-26 DIAGNOSIS — Z743 Need for continuous supervision: Secondary | ICD-10-CM | POA: Diagnosis not present

## 2023-03-26 DIAGNOSIS — Z880 Allergy status to penicillin: Secondary | ICD-10-CM | POA: Diagnosis not present

## 2023-03-26 DIAGNOSIS — R531 Weakness: Secondary | ICD-10-CM | POA: Diagnosis not present

## 2023-03-26 DIAGNOSIS — I6523 Occlusion and stenosis of bilateral carotid arteries: Secondary | ICD-10-CM | POA: Diagnosis not present

## 2023-04-03 DIAGNOSIS — Z299 Encounter for prophylactic measures, unspecified: Secondary | ICD-10-CM | POA: Diagnosis not present

## 2023-04-03 DIAGNOSIS — N183 Chronic kidney disease, stage 3 unspecified: Secondary | ICD-10-CM | POA: Diagnosis not present

## 2023-04-03 DIAGNOSIS — I10 Essential (primary) hypertension: Secondary | ICD-10-CM | POA: Diagnosis not present

## 2023-04-03 DIAGNOSIS — N390 Urinary tract infection, site not specified: Secondary | ICD-10-CM | POA: Diagnosis not present

## 2023-04-03 DIAGNOSIS — G309 Alzheimer's disease, unspecified: Secondary | ICD-10-CM | POA: Diagnosis not present

## 2023-04-13 DIAGNOSIS — M79671 Pain in right foot: Secondary | ICD-10-CM | POA: Diagnosis not present

## 2023-04-13 DIAGNOSIS — M79674 Pain in right toe(s): Secondary | ICD-10-CM | POA: Diagnosis not present

## 2023-04-13 DIAGNOSIS — M79672 Pain in left foot: Secondary | ICD-10-CM | POA: Diagnosis not present

## 2023-04-13 DIAGNOSIS — I739 Peripheral vascular disease, unspecified: Secondary | ICD-10-CM | POA: Diagnosis not present

## 2023-04-13 DIAGNOSIS — M79675 Pain in left toe(s): Secondary | ICD-10-CM | POA: Diagnosis not present

## 2023-04-13 DIAGNOSIS — L110 Acquired keratosis follicularis: Secondary | ICD-10-CM | POA: Diagnosis not present

## 2023-04-15 DIAGNOSIS — M47816 Spondylosis without myelopathy or radiculopathy, lumbar region: Secondary | ICD-10-CM | POA: Diagnosis not present

## 2023-05-01 DIAGNOSIS — I10 Essential (primary) hypertension: Secondary | ICD-10-CM | POA: Diagnosis not present

## 2023-05-01 DIAGNOSIS — Z299 Encounter for prophylactic measures, unspecified: Secondary | ICD-10-CM | POA: Diagnosis not present

## 2023-05-01 DIAGNOSIS — R8290 Unspecified abnormal findings in urine: Secondary | ICD-10-CM | POA: Diagnosis not present

## 2023-05-01 DIAGNOSIS — R600 Localized edema: Secondary | ICD-10-CM | POA: Diagnosis not present

## 2023-06-15 ENCOUNTER — Telehealth (INDEPENDENT_AMBULATORY_CARE_PROVIDER_SITE_OTHER): Payer: Self-pay | Admitting: Otolaryngology

## 2023-06-15 NOTE — Telephone Encounter (Signed)
confirmed appt & location [phone] afm

## 2023-06-16 ENCOUNTER — Ambulatory Visit (INDEPENDENT_AMBULATORY_CARE_PROVIDER_SITE_OTHER): Payer: Medicare Other | Admitting: Otolaryngology

## 2023-06-16 ENCOUNTER — Encounter (INDEPENDENT_AMBULATORY_CARE_PROVIDER_SITE_OTHER): Payer: Self-pay

## 2023-06-16 VITALS — BP 154/81 | HR 66 | Ht 68.0 in | Wt 140.0 lb

## 2023-06-16 DIAGNOSIS — H6123 Impacted cerumen, bilateral: Secondary | ICD-10-CM | POA: Insufficient documentation

## 2023-06-16 NOTE — Progress Notes (Signed)
Patient ID: Debra Spencer, female   DOB: 1927/07/25, 89 y.o.   MRN: [id number]  Procedure: Bilateral cerumen disimpaction.   Indication: Cerumen impaction, resulting in ear discomfort and conductive hearing loss.   Description: The patient is placed supine on the operating table. Under the operating microscope, the right ear canal is examined and is noted to be impacted with cerumen. The cerumen is carefully removed with a combination of suction catheters, cerumen curette, and alligator forceps. After the cerumen removal, the ear canal and tympanic membrane are noted to be normal. No middle ear effusion is noted. The same procedure is then repeated on the left side without exception. The patient tolerated the procedure well.  Follow-up care:  The patient is instructed not to use Q-tips to clean the ear canals. The patient will follow up in 12 months.

## 2023-06-22 DIAGNOSIS — M79674 Pain in right toe(s): Secondary | ICD-10-CM | POA: Diagnosis not present

## 2023-06-22 DIAGNOSIS — M79675 Pain in left toe(s): Secondary | ICD-10-CM | POA: Diagnosis not present

## 2023-06-22 DIAGNOSIS — L110 Acquired keratosis follicularis: Secondary | ICD-10-CM | POA: Diagnosis not present

## 2023-06-22 DIAGNOSIS — M79672 Pain in left foot: Secondary | ICD-10-CM | POA: Diagnosis not present

## 2023-06-22 DIAGNOSIS — I739 Peripheral vascular disease, unspecified: Secondary | ICD-10-CM | POA: Diagnosis not present

## 2023-06-22 DIAGNOSIS — M79671 Pain in right foot: Secondary | ICD-10-CM | POA: Diagnosis not present

## 2023-08-13 DIAGNOSIS — Z7189 Other specified counseling: Secondary | ICD-10-CM | POA: Diagnosis not present

## 2023-08-13 DIAGNOSIS — G309 Alzheimer's disease, unspecified: Secondary | ICD-10-CM | POA: Diagnosis not present

## 2023-08-13 DIAGNOSIS — N1831 Chronic kidney disease, stage 3a: Secondary | ICD-10-CM | POA: Diagnosis not present

## 2023-08-13 DIAGNOSIS — I10 Essential (primary) hypertension: Secondary | ICD-10-CM | POA: Diagnosis not present

## 2023-08-13 DIAGNOSIS — I700 Atherosclerosis of aorta: Secondary | ICD-10-CM | POA: Diagnosis not present

## 2023-08-13 DIAGNOSIS — Z0000 Encounter for general adult medical examination without abnormal findings: Secondary | ICD-10-CM | POA: Diagnosis not present

## 2023-08-13 DIAGNOSIS — Z299 Encounter for prophylactic measures, unspecified: Secondary | ICD-10-CM | POA: Diagnosis not present

## 2023-08-26 DIAGNOSIS — H401230 Low-tension glaucoma, bilateral, stage unspecified: Secondary | ICD-10-CM | POA: Diagnosis not present

## 2023-08-31 DIAGNOSIS — M79672 Pain in left foot: Secondary | ICD-10-CM | POA: Diagnosis not present

## 2023-08-31 DIAGNOSIS — I739 Peripheral vascular disease, unspecified: Secondary | ICD-10-CM | POA: Diagnosis not present

## 2023-08-31 DIAGNOSIS — M79674 Pain in right toe(s): Secondary | ICD-10-CM | POA: Diagnosis not present

## 2023-08-31 DIAGNOSIS — M79671 Pain in right foot: Secondary | ICD-10-CM | POA: Diagnosis not present

## 2023-08-31 DIAGNOSIS — M79675 Pain in left toe(s): Secondary | ICD-10-CM | POA: Diagnosis not present

## 2023-08-31 DIAGNOSIS — L110 Acquired keratosis follicularis: Secondary | ICD-10-CM | POA: Diagnosis not present

## 2023-09-15 DIAGNOSIS — H401230 Low-tension glaucoma, bilateral, stage unspecified: Secondary | ICD-10-CM | POA: Diagnosis not present

## 2023-09-15 DIAGNOSIS — H524 Presbyopia: Secondary | ICD-10-CM | POA: Diagnosis not present

## 2023-10-14 DIAGNOSIS — H401230 Low-tension glaucoma, bilateral, stage unspecified: Secondary | ICD-10-CM | POA: Diagnosis not present

## 2023-10-22 DIAGNOSIS — Z299 Encounter for prophylactic measures, unspecified: Secondary | ICD-10-CM | POA: Diagnosis not present

## 2023-10-22 DIAGNOSIS — D492 Neoplasm of unspecified behavior of bone, soft tissue, and skin: Secondary | ICD-10-CM | POA: Diagnosis not present

## 2023-10-22 DIAGNOSIS — R21 Rash and other nonspecific skin eruption: Secondary | ICD-10-CM | POA: Diagnosis not present

## 2023-10-22 DIAGNOSIS — I10 Essential (primary) hypertension: Secondary | ICD-10-CM | POA: Diagnosis not present

## 2023-11-10 DIAGNOSIS — M79675 Pain in left toe(s): Secondary | ICD-10-CM | POA: Diagnosis not present

## 2023-11-10 DIAGNOSIS — M79672 Pain in left foot: Secondary | ICD-10-CM | POA: Diagnosis not present

## 2023-11-10 DIAGNOSIS — M79674 Pain in right toe(s): Secondary | ICD-10-CM | POA: Diagnosis not present

## 2023-11-10 DIAGNOSIS — L110 Acquired keratosis follicularis: Secondary | ICD-10-CM | POA: Diagnosis not present

## 2023-11-10 DIAGNOSIS — M79671 Pain in right foot: Secondary | ICD-10-CM | POA: Diagnosis not present

## 2023-11-10 DIAGNOSIS — I739 Peripheral vascular disease, unspecified: Secondary | ICD-10-CM | POA: Diagnosis not present

## 2023-12-24 DIAGNOSIS — W19XXXA Unspecified fall, initial encounter: Secondary | ICD-10-CM | POA: Diagnosis not present

## 2023-12-24 DIAGNOSIS — S298XXA Other specified injuries of thorax, initial encounter: Secondary | ICD-10-CM | POA: Diagnosis not present

## 2023-12-24 DIAGNOSIS — Z299 Encounter for prophylactic measures, unspecified: Secondary | ICD-10-CM | POA: Diagnosis not present

## 2023-12-24 DIAGNOSIS — I10 Essential (primary) hypertension: Secondary | ICD-10-CM | POA: Diagnosis not present

## 2023-12-24 DIAGNOSIS — N1831 Chronic kidney disease, stage 3a: Secondary | ICD-10-CM | POA: Diagnosis not present

## 2023-12-24 DIAGNOSIS — R5383 Other fatigue: Secondary | ICD-10-CM | POA: Diagnosis not present

## 2024-01-01 DIAGNOSIS — I10 Essential (primary) hypertension: Secondary | ICD-10-CM | POA: Diagnosis not present

## 2024-01-01 DIAGNOSIS — I700 Atherosclerosis of aorta: Secondary | ICD-10-CM | POA: Diagnosis not present

## 2024-01-01 DIAGNOSIS — R5383 Other fatigue: Secondary | ICD-10-CM | POA: Diagnosis not present

## 2024-01-01 DIAGNOSIS — Z0000 Encounter for general adult medical examination without abnormal findings: Secondary | ICD-10-CM | POA: Diagnosis not present

## 2024-01-01 DIAGNOSIS — Z299 Encounter for prophylactic measures, unspecified: Secondary | ICD-10-CM | POA: Diagnosis not present

## 2024-01-01 DIAGNOSIS — E7800 Pure hypercholesterolemia, unspecified: Secondary | ICD-10-CM | POA: Diagnosis not present

## 2024-01-18 DIAGNOSIS — L110 Acquired keratosis follicularis: Secondary | ICD-10-CM | POA: Diagnosis not present

## 2024-01-18 DIAGNOSIS — M79675 Pain in left toe(s): Secondary | ICD-10-CM | POA: Diagnosis not present

## 2024-01-18 DIAGNOSIS — M79672 Pain in left foot: Secondary | ICD-10-CM | POA: Diagnosis not present

## 2024-01-18 DIAGNOSIS — I739 Peripheral vascular disease, unspecified: Secondary | ICD-10-CM | POA: Diagnosis not present

## 2024-01-18 DIAGNOSIS — M79671 Pain in right foot: Secondary | ICD-10-CM | POA: Diagnosis not present

## 2024-01-18 DIAGNOSIS — M79674 Pain in right toe(s): Secondary | ICD-10-CM | POA: Diagnosis not present

## 2024-01-20 DIAGNOSIS — G309 Alzheimer's disease, unspecified: Secondary | ICD-10-CM | POA: Diagnosis not present

## 2024-01-20 DIAGNOSIS — R5383 Other fatigue: Secondary | ICD-10-CM | POA: Diagnosis not present

## 2024-01-20 DIAGNOSIS — E7800 Pure hypercholesterolemia, unspecified: Secondary | ICD-10-CM | POA: Diagnosis not present

## 2024-01-20 DIAGNOSIS — Z299 Encounter for prophylactic measures, unspecified: Secondary | ICD-10-CM | POA: Diagnosis not present

## 2024-01-20 DIAGNOSIS — Z79899 Other long term (current) drug therapy: Secondary | ICD-10-CM | POA: Diagnosis not present

## 2024-01-20 DIAGNOSIS — R52 Pain, unspecified: Secondary | ICD-10-CM | POA: Diagnosis not present

## 2024-01-20 DIAGNOSIS — I10 Essential (primary) hypertension: Secondary | ICD-10-CM | POA: Diagnosis not present

## 2024-02-23 DIAGNOSIS — Z299 Encounter for prophylactic measures, unspecified: Secondary | ICD-10-CM | POA: Diagnosis not present

## 2024-02-23 DIAGNOSIS — M25512 Pain in left shoulder: Secondary | ICD-10-CM | POA: Diagnosis not present

## 2024-02-23 DIAGNOSIS — W19XXXA Unspecified fall, initial encounter: Secondary | ICD-10-CM | POA: Diagnosis not present

## 2024-02-23 DIAGNOSIS — I10 Essential (primary) hypertension: Secondary | ICD-10-CM | POA: Diagnosis not present

## 2024-03-03 DIAGNOSIS — H401230 Low-tension glaucoma, bilateral, stage unspecified: Secondary | ICD-10-CM | POA: Diagnosis not present

## 2024-03-03 DIAGNOSIS — H353131 Nonexudative age-related macular degeneration, bilateral, early dry stage: Secondary | ICD-10-CM | POA: Diagnosis not present

## 2024-03-03 DIAGNOSIS — H1045 Other chronic allergic conjunctivitis: Secondary | ICD-10-CM | POA: Diagnosis not present

## 2024-03-03 DIAGNOSIS — H524 Presbyopia: Secondary | ICD-10-CM | POA: Diagnosis not present

## 2024-03-03 DIAGNOSIS — L309 Dermatitis, unspecified: Secondary | ICD-10-CM | POA: Diagnosis not present

## 2024-03-31 ENCOUNTER — Ambulatory Visit (INDEPENDENT_AMBULATORY_CARE_PROVIDER_SITE_OTHER): Admitting: Otolaryngology

## 2024-04-05 ENCOUNTER — Encounter (INDEPENDENT_AMBULATORY_CARE_PROVIDER_SITE_OTHER): Payer: Self-pay | Admitting: Otolaryngology

## 2024-04-05 ENCOUNTER — Ambulatory Visit (INDEPENDENT_AMBULATORY_CARE_PROVIDER_SITE_OTHER): Admitting: Otolaryngology

## 2024-04-05 VITALS — BP 122/64 | HR 66 | Wt 140.0 lb

## 2024-04-05 DIAGNOSIS — H6123 Impacted cerumen, bilateral: Secondary | ICD-10-CM | POA: Diagnosis not present

## 2024-04-05 NOTE — Progress Notes (Signed)
 Patient ID: Debra Spencer, female   DOB: 07-19-1927, 89 y.o.   MRN: [id number]  Procedure: Bilateral cerumen disimpaction.   Indication: Recurrent cerumen impaction, resulting in ear discomfort and conductive hearing loss.   Description: The patient is placed supine on the operating table. Under the operating microscope, the right ear canal is examined and is noted to be impacted with cerumen. The cerumen is carefully removed with a combination of suction catheters, cerumen curette, and alligator forceps. After the cerumen removal, the ear canal and tympanic membrane are noted to be normal. No middle ear effusion is noted. The same procedure is then repeated on the left side without exception. The patient tolerated the procedure well.  Follow-up care:  The patient is instructed not to use Q-tips to clean the ear canals. The patient will follow up in 8 months.
